# Patient Record
Sex: Female | Born: 1965 | Race: White | Hispanic: No | Marital: Married | Smoking: Never smoker
Health system: Southern US, Community
[De-identification: ages and names within clinical notes are randomized; demographics above are authoritative.]

## PROBLEM LIST (undated history)

## (undated) DIAGNOSIS — E785 Hyperlipidemia, unspecified: Secondary | ICD-10-CM

## (undated) DIAGNOSIS — I10 Essential (primary) hypertension: Secondary | ICD-10-CM

## (undated) DIAGNOSIS — G43909 Migraine, unspecified, not intractable, without status migrainosus: Secondary | ICD-10-CM

---

## 2007-01-16 ENCOUNTER — Ambulatory Visit: Payer: Self-pay | Admitting: Internal Medicine

## 2009-05-26 ENCOUNTER — Ambulatory Visit: Payer: Self-pay

## 2009-06-15 ENCOUNTER — Ambulatory Visit: Payer: Self-pay | Admitting: Women's Health

## 2010-06-02 ENCOUNTER — Ambulatory Visit: Payer: Self-pay | Admitting: Obstetrics and Gynecology

## 2011-07-12 ENCOUNTER — Ambulatory Visit: Payer: Self-pay | Admitting: Obstetrics and Gynecology

## 2011-07-20 ENCOUNTER — Ambulatory Visit: Payer: Self-pay | Admitting: Obstetrics and Gynecology

## 2012-07-12 ENCOUNTER — Ambulatory Visit: Payer: Self-pay | Admitting: Obstetrics and Gynecology

## 2013-07-17 ENCOUNTER — Ambulatory Visit: Payer: Self-pay | Admitting: Obstetrics and Gynecology

## 2014-09-17 ENCOUNTER — Other Ambulatory Visit: Payer: Self-pay | Admitting: Obstetrics and Gynecology

## 2014-09-17 ENCOUNTER — Inpatient Hospital Stay: Admission: RE | Admit: 2014-09-17 | Payer: Self-pay | Source: Ambulatory Visit

## 2014-09-17 DIAGNOSIS — Z1231 Encounter for screening mammogram for malignant neoplasm of breast: Secondary | ICD-10-CM

## 2014-10-08 ENCOUNTER — Ambulatory Visit
Admission: RE | Admit: 2014-10-08 | Discharge: 2014-10-08 | Disposition: A | Discharge status: Home / Self Care | Payer: Managed Care, Other (non HMO) | Source: Ambulatory Visit | Attending: Obstetrics and Gynecology | Admitting: Obstetrics and Gynecology

## 2014-10-08 DIAGNOSIS — Z1231 Encounter for screening mammogram for malignant neoplasm of breast: Secondary | ICD-10-CM | POA: Diagnosis present

## 2015-07-23 ENCOUNTER — Encounter: Payer: Self-pay | Admitting: *Deleted

## 2015-07-24 ENCOUNTER — Ambulatory Visit
Admission: RE | Admit: 2015-07-24 | Discharge: 2015-07-24 | Disposition: A | Discharge status: Home / Self Care | Payer: Managed Care, Other (non HMO) | Source: Ambulatory Visit | Attending: Gastroenterology | Admitting: Gastroenterology

## 2015-07-24 ENCOUNTER — Ambulatory Visit: Payer: Managed Care, Other (non HMO) | Admitting: Certified Registered Nurse Anesthetist

## 2015-07-24 ENCOUNTER — Encounter
Admission: RE | Disposition: A | Discharge status: Home / Self Care | Payer: Self-pay | Source: Ambulatory Visit | Attending: Gastroenterology

## 2015-07-24 ENCOUNTER — Encounter: Payer: Self-pay | Admitting: *Deleted

## 2015-07-24 DIAGNOSIS — K640 First degree hemorrhoids: Secondary | ICD-10-CM | POA: Diagnosis not present

## 2015-07-24 DIAGNOSIS — Z1211 Encounter for screening for malignant neoplasm of colon: Secondary | ICD-10-CM | POA: Insufficient documentation

## 2015-07-24 DIAGNOSIS — I10 Essential (primary) hypertension: Secondary | ICD-10-CM | POA: Diagnosis not present

## 2015-07-24 DIAGNOSIS — D123 Benign neoplasm of transverse colon: Secondary | ICD-10-CM | POA: Insufficient documentation

## 2015-07-24 DIAGNOSIS — D120 Benign neoplasm of cecum: Secondary | ICD-10-CM | POA: Insufficient documentation

## 2015-07-24 DIAGNOSIS — Z8371 Family history of colonic polyps: Secondary | ICD-10-CM | POA: Diagnosis not present

## 2015-07-24 DIAGNOSIS — G43909 Migraine, unspecified, not intractable, without status migrainosus: Secondary | ICD-10-CM | POA: Insufficient documentation

## 2015-07-24 DIAGNOSIS — E785 Hyperlipidemia, unspecified: Secondary | ICD-10-CM | POA: Diagnosis not present

## 2015-07-24 HISTORY — DX: Migraine, unspecified, not intractable, without status migrainosus: G43.909

## 2015-07-24 HISTORY — DX: Essential (primary) hypertension: I10

## 2015-07-24 HISTORY — DX: Hyperlipidemia, unspecified: E78.5

## 2015-07-24 HISTORY — PX: COLONOSCOPY WITH PROPOFOL: SHX5780

## 2015-07-24 SURGERY — COLONOSCOPY WITH PROPOFOL
Anesthesia: General

## 2015-07-24 MED ORDER — FENTANYL CITRATE (PF) 100 MCG/2ML IJ SOLN
INTRAMUSCULAR | Status: DC | PRN
  Administered 2015-07-24: 50 ug via INTRAVENOUS

## 2015-07-24 MED ORDER — MIDAZOLAM HCL 2 MG/2ML IJ SOLN
INTRAMUSCULAR | Status: DC | PRN
  Administered 2015-07-24: 2 mg via INTRAVENOUS

## 2015-07-24 MED ORDER — LIDOCAINE HCL (CARDIAC) 20 MG/ML IV SOLN
INTRAVENOUS | Status: DC | PRN
  Administered 2015-07-24: 60 mg via INTRAVENOUS

## 2015-07-24 MED ORDER — SODIUM CHLORIDE 0.9 % IV SOLN
INTRAVENOUS | Status: DC
  Administered 2015-07-24: 14:00:00 via INTRAVENOUS

## 2015-07-24 MED ORDER — PROPOFOL 10 MG/ML IV BOLUS
INTRAVENOUS | Status: DC | PRN
  Administered 2015-07-24: 50 mg via INTRAVENOUS
  Administered 2015-07-24: 20 mg via INTRAVENOUS
  Administered 2015-07-24: 10 mg via INTRAVENOUS
  Administered 2015-07-24: 50 mg via INTRAVENOUS

## 2015-07-24 MED ORDER — PROPOFOL 500 MG/50ML IV EMUL
INTRAVENOUS | Status: DC | PRN
  Administered 2015-07-24: 140 ug/kg/min via INTRAVENOUS

## 2015-07-24 NOTE — Anesthesia Preprocedure Evaluation (Signed)
Anesthesia Evaluation  Patient identified by MRN, date of birth, ID band Patient awake    Reviewed: Allergy & Precautions, H&P , NPO status , Patient's Chart, lab work & pertinent test results, reviewed documented beta blocker date and time   Airway Mallampati: II  TM Distance: >3 FB Neck ROM: full    Dental no notable dental hx. (+) Teeth Intact, Missing   Pulmonary neg pulmonary ROS,    Pulmonary exam normal breath sounds clear to auscultation       Cardiovascular Exercise Tolerance: Good negative cardio ROS Normal cardiovascular exam Rhythm:regular Rate:Normal     Neuro/Psych negative neurological ROS  negative psych ROS   GI/Hepatic negative GI ROS, Neg liver ROS,   Endo/Other  negative endocrine ROS  Renal/GU negative Renal ROS  negative genitourinary   Musculoskeletal   Abdominal   Peds  Hematology negative hematology ROS (+)   Anesthesia Other Findings Past Medical History:   Elevated lipids                                              Migraine                                                     Hypertension                                                 Reproductive/Obstetrics negative OB ROS                             Anesthesia Physical Anesthesia Plan  ASA: I  Anesthesia Plan: General   Post-op Pain Management:    Induction:   Airway Management Planned:   Additional Equipment:   Intra-op Plan:   Post-operative Plan:   Informed Consent: I have reviewed the patients History and Physical, chart, labs and discussed the procedure including the risks, benefits and alternatives for the proposed anesthesia with the patient or authorized representative who has indicated his/her understanding and acceptance.   Dental Advisory Given  Plan Discussed with: Anesthesiologist, CRNA and Surgeon  Anesthesia Plan Comments:         Anesthesia Quick Evaluation

## 2015-07-24 NOTE — Transfer of Care (Signed)
Immediate Anesthesia Transfer of Care Note  Patient: Barbara Griffin  Procedure(s) Performed: Procedure(s): COLONOSCOPY WITH PROPOFOL (N/A)  Patient Location: PACU  Anesthesia Type:General  Level of Consciousness: awake and alert   Airway & Oxygen Therapy: Patient Spontanous Breathing and Patient connected to nasal cannula oxygen  Post-op Assessment: Report given to RN and Post -op Vital signs reviewed and stable  Post vital signs: Reviewed and stable  Last Vitals:  Filed Vitals:   07/24/15 1359  BP: 120/64  Pulse: 88  Temp: 36.3 C  Resp: 16    Last Pain: There were no vitals filed for this visit.       Complications: No apparent anesthesia complications

## 2015-07-24 NOTE — H&P (Signed)
  Primary Care Physician:  Idelle Crouch, MD  Pre-Procedure History & Physical: HPI:  Barbara Griffin is a 50 y.o. female is here for an colonoscopy.   Past Medical History  Diagnosis Date  . Elevated lipids   . Migraine   . Hypertension     History reviewed. No pertinent past surgical history.  Prior to Admission medications   Medication Sig Start Date End Date Taking? Authorizing Provider  acetaminophen (TYLENOL) 325 MG tablet Take 650 mg by mouth every 6 (six) hours as needed.   Yes Historical Provider, MD  norethindrone-ethinyl estradiol (CYCLAFEM,ALYACEN) 0.5/0.75/1-35 MG-MCG tablet Take 1 tablet by mouth daily.   Yes Historical Provider, MD    Allergies as of 07/15/2015  . (Not on File)    Family History  Problem Relation Age of Onset  . Breast cancer Paternal Grandmother 68  . Breast cancer Sister 34    Social History   Social History  . Marital Status: Married    Spouse Name: N/A  . Number of Children: N/A  . Years of Education: N/A   Occupational History  . Not on file.   Social History Main Topics  . Smoking status: Never Smoker   . Smokeless tobacco: Not on file  . Alcohol Use: Yes  . Drug Use: No  . Sexual Activity: Not on file   Other Topics Concern  . Not on file   Social History Narrative     Physical Exam: BP 120/64 mmHg  Pulse 88  Temp(Src) 97.4 F (36.3 C) (Tympanic)  Resp 16  Ht 5\' 4"  (1.626 m)  Wt 52.164 kg (115 lb)  BMI 19.73 kg/m2  SpO2 100%  LMP  General:   Alert,  pleasant and cooperative in NAD Head:  Normocephalic and atraumatic. Neck:  Supple; no masses or thyromegaly. Lungs:  Clear throughout to auscultation.    Heart:  Regular rate and rhythm. Abdomen:  Soft, nontender and nondistended. Normal bowel sounds, without guarding, and without rebound.   Neurologic:  Alert and  oriented x4;  grossly normal neurologically.  Impression/Plan: Venida Dunkley is here for an colonoscopy to be performed for screening, + fam  hx polyps  Risks, benefits, limitations, and alternatives regarding  colonoscopy have been reviewed with the patient.  Questions have been answered.  All parties agreeable.   Josefine Class, MD  07/24/2015, 2:41 PM

## 2015-07-24 NOTE — Op Note (Signed)
[facility]astroenterology Patient Name: Barbara Griffin Procedure Date: 07/24/2015 2:50 PM MRN: [id number] Account #: [id number] Date of Birth: 1965-06-22 Admit Type: Outpatient Age: 50 Room: [facility] ENDO ROOM 2 Gender: Female Note Status: Finalized Procedure:            Colonoscopy Indications:          Colon cancer screening in patient at increased risk:                        Family history of 1st-degree relative (sister) with                        colon polyps before age 71 years, This is the patient's                        first colonoscopy, MGF with colon cancer. Patient Profile:      This is a 50 year old female. Providers:            Gerrit Heck. Rayann Heman, MD Referring MD:         Leonie Douglas. Doy Hutching, MD (Referring MD) Medicines:            Propofol per Anesthesia Complications:        No immediate complications. Procedure:            Pre-Anesthesia Assessment:                       - Prior to the procedure, a History and Physical was                        performed, and patient medications, allergies and                        sensitivities were reviewed. The patient's tolerance of                        previous anesthesia was reviewed.                       After obtaining informed consent, the colonoscope was                        passed under direct vision. Throughout the procedure,                        the patient's blood pressure, pulse, and oxygen                        saturations were monitored continuously. The Olympus                        PCF-H180AL colonoscope ( S#: [id number] ) was introduced                        through the anus and advanced to the the cecum,                        identified by appendiceal orifice and ileocecal valve.                        The  colonoscopy was somewhat difficult due to a                        tortuous colon. The patient tolerated the procedure                        well. The quality of the bowel  preparation was                        excellent. Findings:      The perianal and digital rectal examinations were normal.      A 3 mm polyp was found in the cecum. The polyp was sessile. The polyp       was removed with a jumbo cold forceps. Resection and retrieval were       complete.      A 3 mm polyp was found in the proximal transverse colon. The polyp was       sessile. The polyp was removed with a jumbo cold forceps. Resection and       retrieval were complete.      Internal hemorrhoids were found during retroflexion. The hemorrhoids       were Grade I (internal hemorrhoids that do not prolapse).      The exam was otherwise without abnormality. Impression:           - One 3 mm polyp in the cecum, removed with a jumbo                        cold forceps. Resected and retrieved.                       - One 3 mm polyp in the proximal transverse colon,                        removed with a jumbo cold forceps. Resected and                        retrieved.                       - Internal hemorrhoids.                       - The examination was otherwise normal. Recommendation:       - Observe patient in GI recovery unit.                       - High fiber diet.                       - Continue present medications.                       - Await pathology results.                       - Repeat colonoscopy for surveillance based on                        pathology results.                       - Return to referring physician.                       -  The findings and recommendations were discussed with                        the patient.                       - The findings and recommendations were discussed with                        the patient's family. Procedure Code(s):    --- Professional ---                       [phone], Colonoscopy, flexible; with biopsy, single or                        multiple Diagnosis Code(s):    --- Professional ---                       Z83.71,  Family history of colonic polyps                       D12.0, Benign neoplasm of cecum                       D12.3, Benign neoplasm of transverse colon (hepatic                        flexure or splenic flexure)                       K64.0, First degree hemorrhoids CPT copyright 2016 American Medical Association. All rights reserved. The codes documented in this report are preliminary and upon coder review may  be revised to meet current compliance requirements. Mellody Life, MD 07/24/2015 3:16:11 PM This report has been signed electronically. Number of Addenda: 0 Note Initiated On: 07/24/2015 2:50 PM Scope Withdrawal Time: 0 hours 12 minutes 18 seconds  Total Procedure Duration: 0 hours 18 minutes 49 seconds       [facility]

## 2015-07-24 NOTE — Discharge Instructions (Signed)

## 2015-07-24 NOTE — Anesthesia Procedure Notes (Signed)
Performed by: Daija Routson Pre-anesthesia Checklist: Patient identified, Emergency Drugs available, Patient being monitored, Suction available and Timeout performed Patient Re-evaluated:Patient Re-evaluated prior to inductionOxygen Delivery Method: Nasal cannula Intubation Type: IV induction       

## 2015-07-24 NOTE — Anesthesia Postprocedure Evaluation (Signed)
Anesthesia Post Note  Patient: Barbara Griffin  Procedure(s) Performed: Procedure(s) (LRB): COLONOSCOPY WITH PROPOFOL (N/A)  Patient location during evaluation: Endoscopy Anesthesia Type: General Level of consciousness: awake and alert Pain management: pain level controlled Vital Signs Assessment: post-procedure vital signs reviewed and stable Respiratory status: spontaneous breathing, nonlabored ventilation, respiratory function stable and patient connected to nasal cannula oxygen Cardiovascular status: blood pressure returned to baseline and stable Postop Assessment: no signs of nausea or vomiting Anesthetic complications: no    Last Vitals:  Filed Vitals:   07/24/15 1550 07/24/15 1600  BP: 99/61 103/69  Pulse: 59 69  Temp:    Resp: 14 27    Last Pain: There were no vitals filed for this visit.               Martha Clan

## 2015-07-27 ENCOUNTER — Encounter: Payer: Self-pay | Admitting: Gastroenterology

## 2015-07-28 LAB — SURGICAL PATHOLOGY

## 2015-09-17 ENCOUNTER — Other Ambulatory Visit: Payer: Self-pay | Admitting: Obstetrics and Gynecology

## 2015-09-17 DIAGNOSIS — Z1231 Encounter for screening mammogram for malignant neoplasm of breast: Secondary | ICD-10-CM

## 2015-09-17 DIAGNOSIS — Z13820 Encounter for screening for osteoporosis: Secondary | ICD-10-CM

## 2015-10-20 ENCOUNTER — Ambulatory Visit: Payer: Managed Care, Other (non HMO)

## 2015-11-10 ENCOUNTER — Ambulatory Visit
Admission: RE | Admit: 2015-11-10 | Discharge: 2015-11-10 | Disposition: A | Discharge status: Home / Self Care | Payer: Managed Care, Other (non HMO) | Source: Ambulatory Visit | Attending: Obstetrics and Gynecology | Admitting: Obstetrics and Gynecology

## 2015-11-10 DIAGNOSIS — Z13820 Encounter for screening for osteoporosis: Secondary | ICD-10-CM | POA: Diagnosis present

## 2015-11-10 DIAGNOSIS — Z1231 Encounter for screening mammogram for malignant neoplasm of breast: Secondary | ICD-10-CM | POA: Diagnosis not present

## 2016-09-20 ENCOUNTER — Other Ambulatory Visit: Payer: Self-pay | Admitting: Obstetrics and Gynecology

## 2016-09-20 DIAGNOSIS — Z1231 Encounter for screening mammogram for malignant neoplasm of breast: Secondary | ICD-10-CM

## 2016-11-15 ENCOUNTER — Ambulatory Visit
Admission: RE | Admit: 2016-11-15 | Discharge: 2016-11-15 | Disposition: A | Discharge status: Home / Self Care | Payer: Commercial Managed Care - PPO | Source: Ambulatory Visit | Attending: Obstetrics and Gynecology | Admitting: Obstetrics and Gynecology

## 2016-11-15 DIAGNOSIS — Z1231 Encounter for screening mammogram for malignant neoplasm of breast: Secondary | ICD-10-CM

## 2017-12-19 ENCOUNTER — Other Ambulatory Visit: Payer: Self-pay | Admitting: Obstetrics and Gynecology

## 2017-12-19 DIAGNOSIS — Z1231 Encounter for screening mammogram for malignant neoplasm of breast: Secondary | ICD-10-CM

## 2018-01-03 ENCOUNTER — Ambulatory Visit
Admission: RE | Admit: 2018-01-03 | Discharge: 2018-01-03 | Disposition: A | Discharge status: Home / Self Care | Payer: Commercial Managed Care - PPO | Source: Ambulatory Visit | Attending: Obstetrics and Gynecology | Admitting: Obstetrics and Gynecology

## 2018-01-03 DIAGNOSIS — Z1231 Encounter for screening mammogram for malignant neoplasm of breast: Secondary | ICD-10-CM

## 2018-01-11 ENCOUNTER — Other Ambulatory Visit: Payer: Self-pay | Admitting: Obstetrics and Gynecology

## 2018-01-11 DIAGNOSIS — R928 Other abnormal and inconclusive findings on diagnostic imaging of breast: Secondary | ICD-10-CM

## 2018-01-22 ENCOUNTER — Ambulatory Visit
Admission: RE | Admit: 2018-01-22 | Discharge: 2018-01-22 | Disposition: A | Discharge status: Home / Self Care | Payer: Commercial Managed Care - PPO | Source: Ambulatory Visit | Attending: Obstetrics and Gynecology | Admitting: Obstetrics and Gynecology

## 2018-01-22 DIAGNOSIS — R928 Other abnormal and inconclusive findings on diagnostic imaging of breast: Secondary | ICD-10-CM

## 2018-08-17 ENCOUNTER — Other Ambulatory Visit: Payer: Self-pay | Admitting: Obstetrics and Gynecology

## 2018-08-17 DIAGNOSIS — N63 Unspecified lump in unspecified breast: Secondary | ICD-10-CM

## 2018-09-19 ENCOUNTER — Ambulatory Visit
Admission: RE | Admit: 2018-09-19 | Discharge: 2018-09-19 | Disposition: A | Discharge status: Home / Self Care | Payer: Commercial Managed Care - PPO | Source: Ambulatory Visit | Attending: Obstetrics and Gynecology | Admitting: Obstetrics and Gynecology

## 2018-09-19 ENCOUNTER — Other Ambulatory Visit: Payer: Self-pay

## 2018-09-19 DIAGNOSIS — N63 Unspecified lump in unspecified breast: Secondary | ICD-10-CM

## 2019-06-06 ENCOUNTER — Ambulatory Visit: Payer: Commercial Managed Care - PPO | Attending: Internal Medicine

## 2019-06-06 DIAGNOSIS — Z23 Encounter for immunization: Secondary | ICD-10-CM

## 2019-06-06 NOTE — Progress Notes (Signed)
   Covid-19 Vaccination Clinic  Name:  Barbara Griffin    MRN: [id number] DOB: [birth year]  06/06/2019  [name] was observed post Covid-19 immunization for 15 minutes without incident. She was provided with Vaccine Information Sheet and instruction to access the V-Safe system.   [name] was instructed to call 911 with any severe reactions post vaccine: Marland Kitchen Difficulty breathing  . Swelling of face and throat  . A fast heartbeat  . A bad rash all over body  . Dizziness and weakness   Immunizations Administered    Name Date Dose VIS Date Route   Pfizer COVID-19 Vaccine 06/06/2019 10:26 AM 0.3 mL 03/08/2019 Intramuscular   Manufacturer: [place]   Lot: [id number]   [place]: [id number]

## 2019-07-03 ENCOUNTER — Ambulatory Visit: Payer: Commercial Managed Care - PPO | Attending: Internal Medicine

## 2019-07-03 DIAGNOSIS — Z23 Encounter for immunization: Secondary | ICD-10-CM

## 2019-07-03 NOTE — Progress Notes (Signed)
   Covid-19 Vaccination Clinic  Name:  Solomia Keppler    MRN: [id number] DOB: 1965/11/12  07/03/2019  Ms. Pless was observed post Covid-19 immunization for 15 minutes without incident. She was provided with Vaccine Information Sheet and instruction to access the V-Safe system.   Ms. Hatch was instructed to call 911 with any severe reactions post vaccine: Marland Kitchen Difficulty breathing  . Swelling of face and throat  . A fast heartbeat  . A bad rash all over body  . Dizziness and weakness   Immunizations Administered    Name Date Dose VIS Date Route   Pfizer COVID-19 Vaccine 07/03/2019 10:41 AM 0.3 mL 03/08/2019 Intramuscular   Manufacturer: [place]   Lot: [id number]   [place]: [id number]

## 2019-08-22 ENCOUNTER — Other Ambulatory Visit: Payer: Self-pay | Admitting: Internal Medicine

## 2019-08-22 DIAGNOSIS — Z1231 Encounter for screening mammogram for malignant neoplasm of breast: Secondary | ICD-10-CM

## 2020-08-26 ENCOUNTER — Other Ambulatory Visit: Payer: Self-pay | Admitting: Internal Medicine

## 2020-08-26 DIAGNOSIS — Z1231 Encounter for screening mammogram for malignant neoplasm of breast: Secondary | ICD-10-CM

## 2020-10-06 ENCOUNTER — Ambulatory Visit
Admission: RE | Admit: 2020-10-06 | Discharge: 2020-10-06 | Disposition: A | Discharge status: Home / Self Care | Payer: Commercial Managed Care - PPO | Source: Ambulatory Visit | Attending: Internal Medicine | Admitting: Internal Medicine

## 2020-10-06 ENCOUNTER — Other Ambulatory Visit: Payer: Self-pay

## 2020-10-06 DIAGNOSIS — Z1231 Encounter for screening mammogram for malignant neoplasm of breast: Secondary | ICD-10-CM | POA: Insufficient documentation

## 2020-12-14 ENCOUNTER — Other Ambulatory Visit: Payer: Self-pay

## 2020-12-14 ENCOUNTER — Ambulatory Visit: Payer: Commercial Managed Care - PPO | Admitting: Dermatology

## 2020-12-14 DIAGNOSIS — D229 Melanocytic nevi, unspecified: Secondary | ICD-10-CM

## 2020-12-14 DIAGNOSIS — D485 Neoplasm of uncertain behavior of skin: Secondary | ICD-10-CM

## 2020-12-14 DIAGNOSIS — C4491 Basal cell carcinoma of skin, unspecified: Secondary | ICD-10-CM

## 2020-12-14 DIAGNOSIS — L578 Other skin changes due to chronic exposure to nonionizing radiation: Secondary | ICD-10-CM

## 2020-12-14 DIAGNOSIS — C44519 Basal cell carcinoma of skin of other part of trunk: Secondary | ICD-10-CM

## 2020-12-14 DIAGNOSIS — D2239 Melanocytic nevi of other parts of face: Secondary | ICD-10-CM | POA: Diagnosis not present

## 2020-12-14 HISTORY — DX: Basal cell carcinoma of skin, unspecified: C44.91

## 2020-12-14 NOTE — Progress Notes (Signed)
   New Patient Visit  Subjective  Barbara Griffin is a 55 y.o. female who presents for the following: Other (Spot on chest that gets scaly sometimes). She has other areas to be evaluated today.  The following portions of the chart were reviewed this encounter and updated as appropriate:   Allergies  Meds  Problems  Med Hx  Surg Hx  Fam Hx     Review of Systems:  No other skin or systemic complaints except as noted in HPI or Assessment and Plan.  Objective  Well appearing patient in no apparent distress; mood and affect are within normal limits.  A focused examination was performed including face, chest. Relevant physical exam findings are noted in the Assessment and Plan.  Right chest medial infraclavicular 1.1 cm scar with pink papule         Right lat infraorbital Brown flat papule   Assessment & Plan  Neoplasm of uncertain behavior of skin Right chest medial infraclavicular  Skin / nail biopsy Type of biopsy: tangential   Informed consent: discussed and consent obtained   Timeout: patient name, date of birth, surgical site, and procedure verified   Procedure prep:  Patient was prepped and draped in usual sterile fashion Prep type:  Isopropyl alcohol Anesthesia: the lesion was anesthetized in a standard fashion   Anesthetic:  1% lidocaine w/ epinephrine 1-100,000 buffered w/ 8.4% NaHCO3 Instrument used: flexible razor blade   Hemostasis achieved with: pressure, aluminum chloride and electrodesiccation   Outcome: patient tolerated procedure well   Post-procedure details: sterile dressing applied and wound care instructions given   Dressing type: bandage and petrolatum    Specimen 1 - Surgical pathology Differential Diagnosis: BCC vs other  Check Margins: No  Nevus Right lat infraorbital Benign appearing, observe. Do not recommend removing due to small size unless it changes or becomes symptomatic.  Actinic Damage - chronic, secondary to cumulative UV  radiation exposure/sun exposure over time - diffuse scaly erythematous macules with underlying dyspigmentation - Recommend daily broad spectrum sunscreen SPF 30+ to sun-exposed areas, reapply every 2 hours as needed.  - Recommend staying in the shade or wearing long sleeves, sun glasses (UVA+UVB protection) and wide brim hats (4-inch brim around the entire circumference of the hat). - Call for new or changing lesions.  Return Pending biopsy results.  I, Ashok Cordia, CMA, am acting as scribe for Sarina Ser, MD . Documentation: I have reviewed the above documentation for accuracy and completeness, and I agree with the above.  Sarina Ser, MD

## 2020-12-14 NOTE — Patient Instructions (Signed)

## 2020-12-17 ENCOUNTER — Encounter: Payer: Self-pay | Admitting: Dermatology

## 2020-12-18 ENCOUNTER — Telehealth: Payer: Self-pay

## 2020-12-18 NOTE — Telephone Encounter (Signed)
Advised pt of bx results/sh ?

## 2020-12-18 NOTE — Telephone Encounter (Signed)
-----   Message from Ralene Bathe, MD sent at 12/18/2020 10:47 AM EDT ----- Diagnosis Skin , right chest medial infraclavicular BASAL CELL CARCINOMA, NODULAR PATTERN, BASE INVOLVED  Cancer - BCC Schedule surgery

## 2021-01-26 ENCOUNTER — Encounter: Payer: Commercial Managed Care - PPO | Admitting: Dermatology

## 2021-02-02 ENCOUNTER — Ambulatory Visit: Payer: Commercial Managed Care - PPO | Admitting: Dermatology

## 2021-02-02 ENCOUNTER — Encounter: Payer: Self-pay | Admitting: Dermatology

## 2021-02-02 ENCOUNTER — Other Ambulatory Visit: Payer: Self-pay

## 2021-02-02 DIAGNOSIS — C44519 Basal cell carcinoma of skin of other part of trunk: Secondary | ICD-10-CM

## 2021-02-02 DIAGNOSIS — C4441 Basal cell carcinoma of skin of scalp and neck: Secondary | ICD-10-CM | POA: Diagnosis not present

## 2021-02-02 MED ORDER — MUPIROCIN 2 % EX OINT: 1.0000 "application " | TOPICAL_OINTMENT | Freq: Every day | CUTANEOUS | 0 refills | Status: AC

## 2021-02-02 NOTE — Patient Instructions (Signed)
Wound Care Instructions  Cleanse wound gently with soap and water once a day then pat dry with clean gauze. Apply a thing coat of Petrolatum (petroleum jelly, "Vaseline") over the wound (unless you have an allergy to this). We recommend that you use a new, sterile tube of Vaseline. Do not pick or remove scabs. Do not remove the yellow or white "healing tissue" from the base of the wound.  Cover the wound with fresh, clean, nonstick gauze and secure with paper tape. You may use Band-Aids in place of gauze and tape if the would is small enough, but would recommend trimming much of the tape off as there is often too much. Sometimes Band-Aids can irritate the skin.  You should call the office for your biopsy report after 1 week if you have not already been contacted.  If you experience any problems, such as abnormal amounts of bleeding, swelling, significant bruising, significant pain, or evidence of infection, please call the office immediately.  FOR ADULT SURGERY PATIENTS: If you need something for pain relief you may take 1 extra strength Tylenol (acetaminophen) AND 2 Ibuprofen (200mg each) together every 4 hours as needed for pain. (do not take these if you are allergic to them or if you have a reason you should not take them.) Typically, you may only need pain medication for 1 to 3 days.   If you have any questions or concerns for your doctor, please call our main line at [phone] and press option 4 to reach your doctor's medical assistant. If no one answers, please leave a voicemail as directed and we will return your call as soon as possible. Messages left after 4 pm will be answered the following business day.   You may also send us a message via MyChart. We typically respond to MyChart messages within 1-2 business days.  For prescription refills, please ask your pharmacy to contact our office. Our fax number is [phone].  If you have an urgent issue when the clinic is closed that  cannot wait until the next business day, you can page your doctor at the number below.    Please note that while we do our best to be available for urgent issues outside of office hours, we are not available 24/7.   If you have an urgent issue and are unable to reach us, you may choose to seek medical care at your doctor's office, retail clinic, urgent care center, or emergency room.  If you have a medical emergency, please immediately call 911 or go to the emergency department.  Pager Numbers  - Dr. Kowalski: [phone]  - Dr. Moye: [phone]  - Dr. Stewart: [phone]  In the event of inclement weather, please call our main line at [phone] for an update on the status of any delays or closures.  Dermatology Medication Tips: Please keep the boxes that topical medications come in in order to help keep track of the instructions about where and how to use these. Pharmacies typically print the medication instructions only on the boxes and not directly on the medication tubes.   If your medication is too expensive, please contact our office at [phone] option 4 or send us a message through MyChart.   We are unable to tell what your co-pay for medications will be in advance as this is different depending on your insurance coverage. However, we may be able to find a substitute medication at lower cost or fill out paperwork to get insurance to cover a needed   medication.   If a prior authorization is required to get your medication covered by your insurance company, please allow us 1-2 business days to complete this process.  Drug prices often vary depending on where the prescription is filled and some pharmacies may offer cheaper prices.  The website www.goodrx.com contains coupons for medications through different pharmacies. The prices here do not account for what the cost may be with help from insurance (it may be cheaper with your insurance), but the website can give you the  price if you did not use any insurance.  - You can print the associated coupon and take it with your prescription to the pharmacy.  - You may also stop by our office during regular business hours and pick up a GoodRx coupon card.  - If you need your prescription sent electronically to a different pharmacy, notify our office through [facility] MyChart or by phone at [phone] option 4.   

## 2021-02-02 NOTE — Progress Notes (Signed)
   Follow-Up Visit   Subjective  Barbara Griffin is a 55 y.o. female who presents for the following: BCC bx proven (R chest medial infraclavicular, pt presents for excision).  The following portions of the chart were reviewed this encounter and updated as appropriate:   Allergies  Meds  Problems  Med Hx  Surg Hx  Fam Hx     Review of Systems:  No other skin or systemic complaints except as noted in HPI or Assessment and Plan.  Objective  Well appearing patient in no apparent distress; mood and affect are within normal limits.  A focused examination was performed including chest. Relevant physical exam findings are noted in the Assessment and Plan.  R chest medical infraclavicular Pink bx site 2.5 x 1.5cm   Assessment & Plan  Basal cell carcinoma (BCC) of skin of other part of torso R chest medial infraclavicular and extending onto the neck/clavicle area  Skin excision  Lesion length (cm):  2.5 Lesion width (cm):  1.5 Margin per side (cm):  0.2 Total excision diameter (cm):  2.9 Informed consent: discussed and consent obtained   Timeout: patient name, date of birth, surgical site, and procedure verified   Procedure prep:  Patient was prepped and draped in usual sterile fashion Prep type:  Isopropyl alcohol and povidone-iodine Anesthesia: the lesion was anesthetized in a standard fashion   Anesthetic:  1% lidocaine w/ epinephrine 1-100,000 buffered w/ 8.4% NaHCO3 (6cclido w/ epi, 3cc bupivicaine, Total = 9cc) Instrument used: #15 blade   Hemostasis achieved with: pressure   Hemostasis achieved with comment:  Electrocautery Outcome: patient tolerated procedure well with no complications   Post-procedure details: sterile dressing applied and wound care instructions given   Dressing type: bandage, pressure dressing and bacitracin (Mupirocin)    Skin repair Complexity:  Complex Final length (cm):  5 Reason for type of repair: reduce tension to allow closure, reduce the  risk of dehiscence, infection, and necrosis, reduce subcutaneous dead space and avoid a hematoma, allow closure of the large defect, preserve normal anatomy, preserve normal anatomical and functional relationships and enhance both functionality and cosmetic results   Undermining: area extensively undermined   Undermining comment:  Undermining Defect 2.9cm Subcutaneous layers (deep stitches):  Suture size:  2-0 Suture type: Vicryl (polyglactin 910)   Subcutaneous suture technique: Inverted Dermal. Fine/surface layer approximation (top stitches):  Suture size:  3-0 Suture type: nylon   Stitches: simple running   Suture removal (days):  7 Hemostasis achieved with: pressure Outcome: patient tolerated procedure well with no complications   Post-procedure details: sterile dressing applied and wound care instructions given   Dressing type: bandage, pressure dressing and bacitracin (Mupirocin)    mupirocin ointment (BACTROBAN) 2 % Apply 1 application topically daily. Qd to excision site  Specimen 1 - Surgical pathology Differential Diagnosis: Bx proven BCC  Check Margins: yes Pink bx site 2.5 x 1.5cm [id number]  BCC bx proven  Start Mupirocin oint qd to excision site  Return in about 1 week (around 02/09/2021) for suture removal and discuss thinning hair.  I, Othelia Pulling, RMA, am acting as scribe for Sarina Ser, MD . Documentation: I have reviewed the above documentation for accuracy and completeness, and I agree with the above.  Sarina Ser, MD

## 2021-02-03 ENCOUNTER — Telehealth: Payer: Self-pay

## 2021-02-03 NOTE — Telephone Encounter (Signed)
Left pt message to call if any problems from yesterday's surgery.Barbara Griffin

## 2021-02-09 ENCOUNTER — Ambulatory Visit (INDEPENDENT_AMBULATORY_CARE_PROVIDER_SITE_OTHER): Payer: Commercial Managed Care - PPO | Admitting: Dermatology

## 2021-02-09 ENCOUNTER — Other Ambulatory Visit: Payer: Self-pay

## 2021-02-09 DIAGNOSIS — L649 Androgenic alopecia, unspecified: Secondary | ICD-10-CM | POA: Diagnosis not present

## 2021-02-09 DIAGNOSIS — Z85828 Personal history of other malignant neoplasm of skin: Secondary | ICD-10-CM | POA: Diagnosis not present

## 2021-02-09 MED ORDER — MINOXIDIL 2.5 MG PO TABS: ORAL_TABLET | ORAL | 5 refills | Status: DC

## 2021-02-09 NOTE — Progress Notes (Signed)
   Follow-Up Visit   Subjective  Barbara Griffin is a 55 y.o. female who presents for the following: post op/suture removal (Pathology proven margins free BCC of the R chest med infraclavicular. Patient is here today for suture removal.) and  Alopecia (Patient has noticed diffuse hair thinning especially over the past years. She is using Biotin daily and would like to discuss other treatment options.).  The following portions of the chart were reviewed this encounter and updated as appropriate:   Tobacco  Allergies  Meds  Problems  Med Hx  Surg Hx  Fam Hx     Review of Systems:  No other skin or systemic complaints except as noted in HPI or Assessment and Plan.  Objective  Well appearing patient in no apparent distress; mood and affect are within normal limits.  A focused examination was performed including the scalp and chest. Relevant physical exam findings are noted in the Assessment and Plan.  Scalp Diffuse thinning of hair.             R chest med infra clavicular Healing excision site.    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 Discussed numerous treatment options including PRP injections and hair transplants (not performed at our office), topical and oral Minoxidil, Biotin, and LLL therapy. Patient will continue Biotin and would like to start Minoxidil 1.25 mg po QD. #15 5RF.  Discussed side effect of temporal hair and ankle swelling.  If she has any of these side effects she is to stop and call the office  minoxidil (LONITEN) 2.5 MG tablet - Scalp Take 1/2 tab po QD.  History of basal cell carcinoma of skin R chest med infra clavicular  Encounter for Removal of Sutures - Incision site at the R chest med infra clavicular is clean, dry  and intact - Wound cleansed, sutures removed, wound cleansed and steri strips applied.  - Discussed pathology results showing margins free basal cell carcinoma.  - Patient advised to keep steri-strips dry until they fall off. - Scars remodel for a full year. - Once steri-strips fall off, patient can apply over-the-counter silicone scar cream each night to help with scar remodeling if desired. - Patient advised to call with any concerns or if they notice any new or changing lesions.  Return in about 6 months (around 08/09/2021) for TBSE and alopecia f/u.  Luther Redo, CMA, am acting as scribe for Sarina Ser, MD . Documentation: I have reviewed the above documentation for accuracy and completeness, and I agree with the above.  Sarina Ser, MD

## 2021-02-09 NOTE — Patient Instructions (Signed)
Recommend Serica moisturizing scar formula cream every night or [facility] brand or Mederma silicone scar sheet every night for the first year after a scar appears to help with scar remodeling if desired. Scars remodel on their own for a full year.   If you have any questions or concerns for your doctor, please call our main line at [phone] and press option 4 to reach your doctor's medical assistant. If no one answers, please leave a voicemail as directed and we will return your call as soon as possible. Messages left after 4 pm will be answered the following business day.   You may also send [place] a message via [facility]. We typically respond to MyChart messages within 1-2 business days.  For prescription refills, please ask your pharmacy to contact our office. Our fax number is [phone].  If you have an urgent issue when the clinic is closed that cannot wait until the next business day, you can page your doctor at the number below.    Please note that while we do our best to be available for urgent issues outside of office hours, we are not available 24/7.   If you have an urgent issue and are unable to reach [place], you may choose to seek medical care at your doctor's office, retail clinic, urgent care center, or emergency room.  If you have a medical emergency, please immediately call 911 or go to the emergency department.  Pager Numbers  - Dr. Nehemiah Massed: [phone]  - Dr. Laurence Ferrari: [phone]  - Dr. Nicole Kindred: [phone]  In the event of inclement weather, please call our main line at [phone] for an update on the status of any delays or closures.  Dermatology Medication Tips: Please keep the boxes that topical medications come in in order to help keep track of the instructions about where and how to use these. Pharmacies typically print the medication instructions only on the boxes and not directly on the medication tubes.   If your medication is too expensive, please contact  our office at [phone] option 4 or send [place] a message through [place].   We are unable to tell what your co-pay for medications will be in advance as this is different depending on your insurance coverage. However, we may be able to find a substitute medication at lower cost or fill out paperwork to get insurance to cover a needed medication.   If a prior authorization is required to get your medication covered by your insurance company, please allow [place] 1-2 business days to complete this process.  Drug prices often vary depending on where the prescription is filled and some pharmacies may offer cheaper prices.  The website www.goodrx.com contains coupons for medications through different pharmacies. The prices here do not account for what the cost may be with help from insurance (it may be cheaper with your insurance), but the website can give you the price if you did not use any insurance.  - You can print the associated coupon and take it with your prescription to the pharmacy.  - You may also stop by our office during regular business hours and pick up a GoodRx coupon card.  - If you need your prescription sent electronically to a different pharmacy, notify our office through [facility] or by phone at [phone] option 4.

## 2021-02-14 ENCOUNTER — Encounter: Payer: Self-pay | Admitting: Dermatology

## 2021-05-13 ENCOUNTER — Other Ambulatory Visit: Payer: Self-pay | Admitting: Obstetrics and Gynecology

## 2021-05-13 DIAGNOSIS — Z1231 Encounter for screening mammogram for malignant neoplasm of breast: Secondary | ICD-10-CM

## 2021-07-29 ENCOUNTER — Other Ambulatory Visit: Payer: Self-pay | Admitting: Dermatology

## 2021-07-29 DIAGNOSIS — L649 Androgenic alopecia, unspecified: Secondary | ICD-10-CM

## 2021-08-10 ENCOUNTER — Ambulatory Visit: Payer: Commercial Managed Care - PPO | Admitting: Dermatology

## 2021-08-10 DIAGNOSIS — L814 Other melanin hyperpigmentation: Secondary | ICD-10-CM

## 2021-08-10 DIAGNOSIS — Z85828 Personal history of other malignant neoplasm of skin: Secondary | ICD-10-CM

## 2021-08-10 DIAGNOSIS — D492 Neoplasm of unspecified behavior of bone, soft tissue, and skin: Secondary | ICD-10-CM

## 2021-08-10 DIAGNOSIS — D229 Melanocytic nevi, unspecified: Secondary | ICD-10-CM

## 2021-08-10 DIAGNOSIS — D1800 Hemangioma unspecified site: Secondary | ICD-10-CM

## 2021-08-10 DIAGNOSIS — D239 Other benign neoplasm of skin, unspecified: Secondary | ICD-10-CM

## 2021-08-10 DIAGNOSIS — D225 Melanocytic nevi of trunk: Secondary | ICD-10-CM

## 2021-08-10 DIAGNOSIS — Z1283 Encounter for screening for malignant neoplasm of skin: Secondary | ICD-10-CM | POA: Diagnosis not present

## 2021-08-10 DIAGNOSIS — L821 Other seborrheic keratosis: Secondary | ICD-10-CM

## 2021-08-10 DIAGNOSIS — L578 Other skin changes due to chronic exposure to nonionizing radiation: Secondary | ICD-10-CM

## 2021-08-10 DIAGNOSIS — L649 Androgenic alopecia, unspecified: Secondary | ICD-10-CM

## 2021-08-10 HISTORY — DX: Other benign neoplasm of skin, unspecified: D23.9

## 2021-08-10 MED ORDER — MINOXIDIL 2.5 MG PO TABS: ORAL_TABLET | ORAL | 3 refills | Status: DC

## 2021-08-10 NOTE — Progress Notes (Unsigned)
Follow-Up Visit   Subjective  Barbara Griffin is a 56 y.o. female who presents for the following: Annual Exam (The patient presents for Total-Body Skin Exam (TBSE) for skin cancer screening and mole check.  The patient has spots, moles and lesions to be evaluated, some may be new or changing and the patient has concerns that these could be cancer. ). 6 months f/u on Alopecia treating with Minoxidil 1/2 tablet daily with a good response, pt would like to increase Minoxidil.  The following portions of the chart were reviewed this encounter and updated as appropriate:   Tobacco  Allergies  Meds  Problems  Med Hx  Surg Hx  Fam Hx     Review of Systems:  No other skin or systemic complaints except as noted in HPI or Assessment and Plan.  Objective  Well appearing patient in no apparent distress; mood and affect are within normal limits.  A full examination was performed including scalp, head, eyes, ears, nose, lips, neck, chest, axillae, abdomen, back, buttocks, bilateral upper extremities, bilateral lower extremities, hands, feet, fingers, toes, fingernails, and toenails. All findings within normal limits unless otherwise noted below.  Scalp Diffuse thinning of the crown and widening of the midline part with retention of the frontal hairline - Reviewed progressive nature and prognosis.            left upper back 3.0 cm lat to spine 0.6 cm irregular brown macule    Assessment & Plan  Androgenic alopecia Scalp See photos in Media  Chronic and persistent condition with duration or expected duration over one year. Condition is symptomatic / bothersome to patient. Not to goal.  Female Androgenic Alopecia is a chronic condition related to genetics and/or hormonal changes.  In women androgenetic alopecia is commonly associated with menopause but may occur any time after puberty.  It causes hair thinning primarily on the crown with widening of the part and temporal hairline  recession.  Can use OTC Rogaine (minoxidil) 5% solution/foam as directed.  Oral treatments in female patients who have no contraindication may include : - Low dose oral minoxidil 1.25 - '5mg'$  daily - Spironolactone 50 - '100mg'$  bid - Finasteride 2.5 - 5 mg daily Adjunctive therapies include: - Low Level Laser Light Therapy (LLLT) - Platelet-rich plasma injections (PRP) - Hair Transplants or scalp reduction   Increase to Minoxidil  to 2.5 mg take 1 tablet daily for hair growth  Related Medications minoxidil (LONITEN) 2.5 MG tablet TAKE 1 TABLET BY MOUTH EVERY DAY for hair growth  Neoplasm of skin Left Upper Back 3.0 cm lat to spine Epidermal / dermal shaving See photos in Media Lesion diameter (cm):  0.6 Informed consent: discussed and consent obtained   Timeout: patient name, date of birth, surgical site, and procedure verified   Procedure prep:  Patient was prepped and draped in usual sterile fashion Prep type:  Isopropyl alcohol Anesthesia: the lesion was anesthetized in a standard fashion   Anesthetic:  1% lidocaine w/ epinephrine 1-100,000 buffered w/ 8.4% NaHCO3 Hemostasis achieved with: pressure, aluminum chloride and electrodesiccation   Outcome: patient tolerated procedure well   Post-procedure details: sterile dressing applied and wound care instructions given   Dressing type: bandage and petrolatum    Specimen 1 - Surgical pathology Differential Diagnosis: R/O Dysplastic nevus  Check Margins: No  Lentigines - Scattered tan macules - Due to sun exposure - Benign-appearing, observe - Recommend daily broad spectrum sunscreen SPF 30+ to sun-exposed areas, reapply every 2 hours  as needed. - Call for any changes  Seborrheic Keratoses - Stuck-on, waxy, tan-brown papules and/or plaques  - Benign-appearing - Discussed benign etiology and prognosis. - Observe - Call for any changes  Melanocytic Nevi - Tan-brown and/or pink-flesh-colored symmetric macules and papules -  Benign appearing on exam today - Observation - Call clinic for new or changing moles - Recommend daily use of broad spectrum spf 30+ sunscreen to sun-exposed areas.   Hemangiomas - Red papules - Discussed benign nature - Observe - Call for any changes  Actinic Damage - Chronic condition, secondary to cumulative UV/sun exposure - diffuse scaly erythematous macules with underlying dyspigmentation - Recommend daily broad spectrum sunscreen SPF 30+ to sun-exposed areas, reapply every 2 hours as needed.  - Staying in the shade or wearing long sleeves, sun glasses (UVA+UVB protection) and wide brim hats (4-inch brim around the entire circumference of the hat) are also recommended for sun protection.  - Call for new or changing lesions.  History of Basal Cell Carcinoma of the Skin Right chest medial infraclavicular 02/02/2021 - No evidence of recurrence today - Recommend regular full body skin exams - Recommend daily broad spectrum sunscreen SPF 30+ to sun-exposed areas, reapply every 2 hours as needed.  - Call if any new or changing lesions are noted between office visits   Skin cancer screening performed today.   Return in about 1 year (around 08/11/2022) for TBSE, hx of BCC.  IMarye Round, CMA, am acting as scribe for Sarina Ser, MD .  Documentation: I have reviewed the above documentation for accuracy and completeness, and I agree with the above.  Sarina Ser, MD

## 2021-08-10 NOTE — Patient Instructions (Addendum)

## 2021-08-16 ENCOUNTER — Telehealth: Payer: Self-pay

## 2021-08-16 NOTE — Telephone Encounter (Signed)
Unable to leave a message. Voicemail box full.

## 2021-08-16 NOTE — Telephone Encounter (Signed)
-----   Message from Ralene Bathe, MD sent at 08/12/2021  5:52 PM EDT ----- Diagnosis Skin , right upper back 3.0 cm lat to spine DYSPLASTIC JUNCTIONAL NEVUS WITH SEVERE ATYPIA, MARGIN CLOSE, SEE DESCRIPTION  Severe dysplastic Schedule surgery

## 2021-08-21 ENCOUNTER — Encounter: Payer: Self-pay | Admitting: Dermatology

## 2021-08-24 ENCOUNTER — Telehealth: Payer: Self-pay

## 2021-08-24 NOTE — Telephone Encounter (Signed)
Called [facility] spoke with Rosann Auerbach, she will correct the location on this pathology report, the correct location is left upper back 3.0 cm lat to spine. Corrected report will be faxed here today

## 2021-08-30 ENCOUNTER — Telehealth: Payer: Self-pay

## 2021-08-30 NOTE — Telephone Encounter (Signed)
Advised patient of results and scheduled her for surgery/hd

## 2021-08-30 NOTE — Telephone Encounter (Signed)
-----   Message from Ralene Bathe, MD sent at 08/26/2021  6:20 PM EDT ----- Diagnosis Skin , left upper back 3.0 cm lat to spine DYSPLASTIC JUNCTIONAL NEVUS WITH SEVERE ATYPIA, MARGIN CLOSE, SEE DESCRIPTION  Corrected report -- changed to LEFT Severe dysplastic Schedule surgery

## 2021-09-01 ENCOUNTER — Telehealth: Payer: Self-pay

## 2021-09-01 NOTE — Telephone Encounter (Signed)
Advised patient of results/hd  

## 2021-10-07 ENCOUNTER — Ambulatory Visit
Admission: RE | Admit: 2021-10-07 | Discharge: 2021-10-07 | Disposition: A | Discharge status: Home / Self Care | Payer: Commercial Managed Care - PPO | Source: Ambulatory Visit | Attending: Obstetrics and Gynecology | Admitting: Obstetrics and Gynecology

## 2021-10-07 ENCOUNTER — Encounter: Payer: Self-pay | Admitting: Radiology

## 2021-10-07 DIAGNOSIS — Z1231 Encounter for screening mammogram for malignant neoplasm of breast: Secondary | ICD-10-CM | POA: Diagnosis present

## 2021-11-03 ENCOUNTER — Telehealth: Payer: Self-pay

## 2021-11-03 NOTE — Telephone Encounter (Signed)
Patient left a VM on the nurse line today inquiring about the Minoxidil dose increase from her 08/10/21 office visit. Patient states that she has had significant hairloss since increasing dose to the 2.'5mg'$ . Patient also states that she is not noticing any new hair growth. She would like to know if this is normal or if she should go back to the lower dose 1.'25mg'$ ? Please advise

## 2021-11-04 NOTE — Telephone Encounter (Signed)
Called patient back this morning and LVM for patient to return our call to go over Dr. Alveria Apley comments and to schedule followup for increased hairloss. AF

## 2021-11-10 ENCOUNTER — Ambulatory Visit: Payer: Commercial Managed Care - PPO | Admitting: Dermatology

## 2021-11-10 DIAGNOSIS — L649 Androgenic alopecia, unspecified: Secondary | ICD-10-CM

## 2021-11-10 DIAGNOSIS — L650 Telogen effluvium: Secondary | ICD-10-CM | POA: Diagnosis not present

## 2021-11-10 NOTE — Progress Notes (Signed)
   Follow-Up Visit   Subjective  Barbara Griffin is a 56 y.o. female who presents for the following: Androgenic Alopecia (Scalp, Increased Minoxidil  to 2.'5mg'$  1 po qd on 08/10/21 and pt feels like hair shedding has increased).  The following portions of the chart were reviewed this encounter and updated as appropriate:   Tobacco  Allergies  Meds  Problems  Med Hx  Surg Hx  Fam Hx     Review of Systems:  No other skin or systemic complaints except as noted in HPI or Assessment and Plan.  Objective  Well appearing patient in no apparent distress; mood and affect are within normal limits.  A focused examination was performed including scalp. Relevant physical exam findings are noted in the Assessment and Plan.  Scalp From L brow to thicker hair is 7.0cm and 6.0cm where fine hair starts Diffuse thinning of hair                   Assessment & Plan  Androgenic alopecia With Telogen Effluvium Scalp Chronic and persistent condition with duration or expected duration over one year. Condition is bothersome/symptomatic for patient. Currently flared - 2ndary to increased dose of Minoxidil? - causing temporary shedding?  Female Androgenic Alopecia is a chronic condition related to genetics and/or hormonal changes.  In women androgenetic alopecia is commonly associated with menopause but may occur any time after puberty.  It causes hair thinning primarily on the crown with widening of the part and temporal hairline recession.  Can use OTC Rogaine (minoxidil) 5% solution/foam as directed.  Oral treatments in female patients who have no contraindication may include : - Low dose oral minoxidil 1.25 - '5mg'$  daily - Spironolactone 50 - '100mg'$  bid - Finasteride 2.5 - 5 mg daily Adjunctive therapies include: - Low Level Laser Light Therapy (LLLT) - Platelet-rich plasma injections (PRP) - Hair Transplants or scalp reduction   Labs from 08/24/21 viewed and wnl Photos viewed from  08/10/21 and see some improvement  Decrease back down to Minoxidil 2.'5mg'$  1/2 pill (1.'25mg'$ ) po qd  Discussed oral Finasteride, may consider adding in future  Related Medications minoxidil (LONITEN) 2.5 MG tablet TAKE 1 TABLET BY MOUTH EVERY DAY for hair growth  Return for as scheduled for surgery and to re-evaluate alopecia.  I, Othelia Pulling, RMA, am acting as scribe for Sarina Ser, MD . Documentation: I have reviewed the above documentation for accuracy and completeness, and I agree with the above.  Sarina Ser, MD

## 2021-11-10 NOTE — Patient Instructions (Signed)
Due to recent changes in healthcare laws, you may see results of your pathology and/or laboratory studies on MyChart before the doctors have had a chance to review them. We understand that in some cases there may be results that are confusing or concerning to you. Please understand that not all results are received at the same time and often the doctors may need to interpret multiple results in order to provide you with the best plan of care or course of treatment. Therefore, we ask that you please give us 2 business days to thoroughly review all your results before contacting the office for clarification. Should we see a critical lab result, you will be contacted sooner.   If You Need Anything After Your Visit  If you have any questions or concerns for your doctor, please call our main line at [phone] and press option 4 to reach your doctor's medical assistant. If no one answers, please leave a voicemail as directed and we will return your call as soon as possible. Messages left after 4 pm will be answered the following business day.   You may also send us a message via MyChart. We typically respond to MyChart messages within 1-2 business days.  For prescription refills, please ask your pharmacy to contact our office. Our fax number is [phone].  If you have an urgent issue when the clinic is closed that cannot wait until the next business day, you can page your doctor at the number below.    Please note that while we do our best to be available for urgent issues outside of office hours, we are not available 24/7.   If you have an urgent issue and are unable to reach us, you may choose to seek medical care at your doctor's office, retail clinic, urgent care center, or emergency room.  If you have a medical emergency, please immediately call 911 or go to the emergency department.  Pager Numbers  - Dr. Kowalski: [phone]  - Dr. Moye: [phone]  - Dr. Stewart:  [phone]  In the event of inclement weather, please call our main line at [phone] for an update on the status of any delays or closures.  Dermatology Medication Tips: Please keep the boxes that topical medications come in in order to help keep track of the instructions about where and how to use these. Pharmacies typically print the medication instructions only on the boxes and not directly on the medication tubes.   If your medication is too expensive, please contact our office at [phone] option 4 or send us a message through MyChart.   We are unable to tell what your co-pay for medications will be in advance as this is different depending on your insurance coverage. However, we may be able to find a substitute medication at lower cost or fill out paperwork to get insurance to cover a needed medication.   If a prior authorization is required to get your medication covered by your insurance company, please allow us 1-2 business days to complete this process.  Drug prices often vary depending on where the prescription is filled and some pharmacies may offer cheaper prices.  The website www.goodrx.com contains coupons for medications through different pharmacies. The prices here do not account for what the cost may be with help from insurance (it may be cheaper with your insurance), but the website can give you the price if you did not use any insurance.  - You can print the associated coupon and take it with   your prescription to the pharmacy.  - You may also stop by our office during regular business hours and pick up a GoodRx coupon card.  - If you need your prescription sent electronically to a different pharmacy, notify our office through [facility] MyChart or by phone at [phone] option 4.     Si Usted Necesita Algo Despus de Su Visita  Tambin puede enviarnos un mensaje a travs de MyChart. Por lo general respondemos a los mensajes de MyChart en el transcurso de 1 a 2  das hbiles.  Para renovar recetas, por favor pida a su farmacia que se ponga en contacto con nuestra oficina. Nuestro nmero de fax es el [phone].  Si tiene un asunto urgente cuando la clnica est cerrada y que no puede esperar hasta el siguiente da hbil, puede llamar/localizar a su doctor(a) al nmero que aparece a continuacin.   Por favor, tenga en cuenta que aunque hacemos todo lo posible para estar disponibles para asuntos urgentes fuera del horario de oficina, no estamos disponibles las 24 horas del da, los 7 das de la semana.   Si tiene un problema urgente y no puede comunicarse con nosotros, puede optar por buscar atencin mdica  en el consultorio de su doctor(a), en una clnica privada, en un centro de atencin urgente o en una sala de emergencias.  Si tiene una emergencia mdica, por favor llame inmediatamente al 911 o vaya a la sala de emergencias.  Nmeros de bper  - Dr. Kowalski: [phone]  - Dra. Moye: [phone]  - Dra. Stewart: [phone]  En caso de inclemencias del tiempo, por favor llame a nuestra lnea principal al [phone] para una actualizacin sobre el estado de cualquier retraso o cierre.  Consejos para la medicacin en dermatologa: Por favor, guarde las cajas en las que vienen los medicamentos de uso tpico para ayudarle a seguir las instrucciones sobre dnde y cmo usarlos. Las farmacias generalmente imprimen las instrucciones del medicamento slo en las cajas y no directamente en los tubos del medicamento.   Si su medicamento es muy caro, por favor, pngase en contacto con nuestra oficina llamando al [phone] y presione la opcin 4 o envenos un mensaje a travs de MyChart.   No podemos decirle cul ser su copago por los medicamentos por adelantado ya que esto es diferente dependiendo de la cobertura de su seguro. Sin embargo, es posible que podamos encontrar un medicamento sustituto a menor costo o llenar un formulario para que el  seguro cubra el medicamento que se considera necesario.   Si se requiere una autorizacin previa para que su compaa de seguros cubra su medicamento, por favor permtanos de 1 a 2 das hbiles para completar este proceso.  Los precios de los medicamentos varan con frecuencia dependiendo del lugar de dnde se surte la receta y alguna farmacias pueden ofrecer precios ms baratos.  El sitio web www.goodrx.com tiene cupones para medicamentos de diferentes farmacias. Los precios aqu no tienen en cuenta lo que podra costar con la ayuda del seguro (puede ser ms barato con su seguro), pero el sitio web puede darle el precio si no utiliz ningn seguro.  - Puede imprimir el cupn correspondiente y llevarlo con su receta a la farmacia.  - Tambin puede pasar por nuestra oficina durante el horario de atencin regular y recoger una tarjeta de cupones de GoodRx.  - Si necesita que su receta se enve electrnicamente a una farmacia diferente, informe a nuestra oficina a travs de MyChart de [facility]   o por telfono llamando al [phone] y presione la opcin 4.  

## 2021-11-17 ENCOUNTER — Encounter: Payer: Self-pay | Admitting: Dermatology

## 2022-02-08 ENCOUNTER — Encounter: Payer: Commercial Managed Care - PPO | Admitting: Dermatology

## 2022-03-08 ENCOUNTER — Encounter: Payer: Self-pay | Admitting: Dermatology

## 2022-03-08 ENCOUNTER — Ambulatory Visit (INDEPENDENT_AMBULATORY_CARE_PROVIDER_SITE_OTHER): Payer: Commercial Managed Care - PPO | Admitting: Dermatology

## 2022-03-08 DIAGNOSIS — L814 Other melanin hyperpigmentation: Secondary | ICD-10-CM | POA: Diagnosis not present

## 2022-03-08 DIAGNOSIS — D225 Melanocytic nevi of trunk: Secondary | ICD-10-CM | POA: Diagnosis not present

## 2022-03-08 DIAGNOSIS — L578 Other skin changes due to chronic exposure to nonionizing radiation: Secondary | ICD-10-CM | POA: Diagnosis not present

## 2022-03-08 DIAGNOSIS — L905 Scar conditions and fibrosis of skin: Secondary | ICD-10-CM | POA: Diagnosis not present

## 2022-03-08 DIAGNOSIS — D492 Neoplasm of unspecified behavior of bone, soft tissue, and skin: Secondary | ICD-10-CM

## 2022-03-08 DIAGNOSIS — D229 Melanocytic nevi, unspecified: Secondary | ICD-10-CM

## 2022-03-08 DIAGNOSIS — D485 Neoplasm of uncertain behavior of skin: Secondary | ICD-10-CM

## 2022-03-08 NOTE — Patient Instructions (Addendum)
Wound Care Instructions  Cleanse wound gently with soap and water once a day then pat dry with clean gauze. Apply a thin coat of Petrolatum (petroleum jelly, "Vaseline") over the wound (unless you have an allergy to this). We recommend that you use a new, sterile tube of Vaseline. Do not pick or remove scabs. Do not remove the yellow or white "healing tissue" from the base of the wound.  Cover the wound with fresh, clean, nonstick gauze and secure with paper tape. You may use Band-Aids in place of gauze and tape if the wound is small enough, but would recommend trimming much of the tape off as there is often too much. Sometimes Band-Aids can irritate the skin.  You should call the office for your biopsy report after 1 week if you have not already been contacted.  If you experience any problems, such as abnormal amounts of bleeding, swelling, significant bruising, significant pain, or evidence of infection, please call the office immediately.  FOR ADULT SURGERY PATIENTS: If you need something for pain relief you may take 1 extra strength Tylenol (acetaminophen) AND 2 Ibuprofen (200mg each) together every 4 hours as needed for pain. (do not take these if you are allergic to them or if you have a reason you should not take them.) Typically, you may only need pain medication for 1 to 3 days.     Due to recent changes in healthcare laws, you may see results of your pathology and/or laboratory studies on MyChart before the doctors have had a chance to review them. We understand that in some cases there may be results that are confusing or concerning to you. Please understand that not all results are received at the same time and often the doctors may need to interpret multiple results in order to provide you with the best plan of care or course of treatment. Therefore, we ask that you please give us 2 business days to thoroughly review all your results before contacting the office for clarification. Should  we see a critical lab result, you will be contacted sooner.   If You Need Anything After Your Visit  If you have any questions or concerns for your doctor, please call our main line at [phone] and press option 4 to reach your doctor's medical assistant. If no one answers, please leave a voicemail as directed and we will return your call as soon as possible. Messages left after 4 pm will be answered the following business day.   You may also send us a message via MyChart. We typically respond to MyChart messages within 1-2 business days.  For prescription refills, please ask your pharmacy to contact our office. Our fax number is [phone].  If you have an urgent issue when the clinic is closed that cannot wait until the next business day, you can page your doctor at the number below.    Please note that while we do our best to be available for urgent issues outside of office hours, we are not available 24/7.   If you have an urgent issue and are unable to reach us, you may choose to seek medical care at your doctor's office, retail clinic, urgent care center, or emergency room.  If you have a medical emergency, please immediately call 911 or go to the emergency department.  Pager Numbers  - Dr. Kowalski: [phone]  - Dr. Moye: [phone]  - Dr. Stewart: [phone]  In the event of inclement weather, please call our main line at   [phone] for an update on the status of any delays or closures.  Dermatology Medication Tips: Please keep the boxes that topical medications come in in order to help keep track of the instructions about where and how to use these. Pharmacies typically print the medication instructions only on the boxes and not directly on the medication tubes.   If your medication is too expensive, please contact our office at [phone] option 4 or send us a message through MyChart.   We are unable to tell what your co-pay for medications will be in  advance as this is different depending on your insurance coverage. However, we may be able to find a substitute medication at lower cost or fill out paperwork to get insurance to cover a needed medication.   If a prior authorization is required to get your medication covered by your insurance company, please allow us 1-2 business days to complete this process.  Drug prices often vary depending on where the prescription is filled and some pharmacies may offer cheaper prices.  The website www.goodrx.com contains coupons for medications through different pharmacies. The prices here do not account for what the cost may be with help from insurance (it may be cheaper with your insurance), but the website can give you the price if you did not use any insurance.  - You can print the associated coupon and take it with your prescription to the pharmacy.  - You may also stop by our office during regular business hours and pick up a GoodRx coupon card.  - If you need your prescription sent electronically to a different pharmacy, notify our office through [facility] MyChart or by phone at [phone] option 4.     Si Usted Necesita Algo Despus de Su Visita  Tambin puede enviarnos un mensaje a travs de MyChart. Por lo general respondemos a los mensajes de MyChart en el transcurso de 1 a 2 das hbiles.  Para renovar recetas, por favor pida a su farmacia que se ponga en contacto con nuestra oficina. Nuestro nmero de fax es el [phone].  Si tiene un asunto urgente cuando la clnica est cerrada y que no puede esperar hasta el siguiente da hbil, puede llamar/localizar a su doctor(a) al nmero que aparece a continuacin.   Por favor, tenga en cuenta que aunque hacemos todo lo posible para estar disponibles para asuntos urgentes fuera del horario de oficina, no estamos disponibles las 24 horas del da, los 7 das de la semana.   Si tiene un problema urgente y no puede comunicarse con nosotros, puede  optar por buscar atencin mdica  en el consultorio de su doctor(a), en una clnica privada, en un centro de atencin urgente o en una sala de emergencias.  Si tiene una emergencia mdica, por favor llame inmediatamente al 911 o vaya a la sala de emergencias.  Nmeros de bper  - Dr. Kowalski: [phone]  - Dra. Moye: [phone]  - Dra. Stewart: [phone]  En caso de inclemencias del tiempo, por favor llame a nuestra lnea principal al [phone] para una actualizacin sobre el estado de cualquier retraso o cierre.  Consejos para la medicacin en dermatologa: Por favor, guarde las cajas en las que vienen los medicamentos de uso tpico para ayudarle a seguir las instrucciones sobre dnde y cmo usarlos. Las farmacias generalmente imprimen las instrucciones del medicamento slo en las cajas y no directamente en los tubos del medicamento.   Si su medicamento es muy caro, por favor, pngase en contacto con   nuestra oficina llamando al [phone] y presione la opcin 4 o envenos un mensaje a travs de MyChart.   No podemos decirle cul ser su copago por los medicamentos por adelantado ya que esto es diferente dependiendo de la cobertura de su seguro. Sin embargo, es posible que podamos encontrar un medicamento sustituto a menor costo o llenar un formulario para que el seguro cubra el medicamento que se considera necesario.   Si se requiere una autorizacin previa para que su compaa de seguros cubra su medicamento, por favor permtanos de 1 a 2 das hbiles para completar este proceso.  Los precios de los medicamentos varan con frecuencia dependiendo del lugar de dnde se surte la receta y alguna farmacias pueden ofrecer precios ms baratos.  El sitio web www.goodrx.com tiene cupones para medicamentos de diferentes farmacias. Los precios aqu no tienen en cuenta lo que podra costar con la ayuda del seguro (puede ser ms barato con su seguro), pero el sitio web puede darle el  precio si no utiliz ningn seguro.  - Puede imprimir el cupn correspondiente y llevarlo con su receta a la farmacia.  - Tambin puede pasar por nuestra oficina durante el horario de atencin regular y recoger una tarjeta de cupones de GoodRx.  - Si necesita que su receta se enve electrnicamente a una farmacia diferente, informe a nuestra oficina a travs de MyChart de [place] o por telfono llamando al [phone] y presione la opcin 4.  

## 2022-03-08 NOTE — Progress Notes (Signed)
Follow-Up Visit   Subjective  Barbara Griffin is a 56 y.o. female who presents for the following: Severe Dysplastic nevus bx proven (left upper back 3.0 cm lat to spine, pt presents for removal today. The patient has spots, moles and lesions to be evaluated, some may be new or changing and the patient has concerns that these could be cancer.  The following portions of the chart were reviewed this encounter and updated as appropriate:   Tobacco  Allergies  Meds  Problems  Med Hx  Surg Hx  Fam Hx     Review of Systems:  No other skin or systemic complaints except as noted in HPI or Assessment and Plan.  Objective  Well appearing patient in no apparent distress; mood and affect are within normal limits.  A focused examination was performed including back. Relevant physical exam findings are noted in the Assessment and Plan.  left upper back 3.0 cm lat to spine Pink bx site  Right mid back braline 0.4 black macule        Assessment & Plan   Actinic Damage - chronic, secondary to cumulative UV radiation exposure/sun exposure over time - diffuse scaly erythematous macules with underlying dyspigmentation - Recommend daily broad spectrum sunscreen SPF 30+ to sun-exposed areas, reapply every 2 hours as needed.  - Recommend staying in the shade or wearing long sleeves, sun glasses (UVA+UVB protection) and wide brim hats (4-inch brim around the entire circumference of the hat). - Call for new or changing lesions.  Lentigines - Scattered tan macules - Due to sun exposure - Benign-appearing, observe - Recommend daily broad spectrum sunscreen SPF 30+ to sun-exposed areas, reapply every 2 hours as needed. - Call for any changes  Neoplasm of skin left upper back 3.0 cm lat to spine  Epidermal / dermal shaving  Lesion diameter (cm):  1.2 Informed consent: discussed and consent obtained   Timeout: patient name, date of birth, surgical site, and procedure verified    Procedure prep:  Patient was prepped and draped in usual sterile fashion Prep type:  Isopropyl alcohol Anesthesia: the lesion was anesthetized in a standard fashion   Anesthetic:  1% lidocaine w/ epinephrine 1-100,000 buffered w/ 8.4% NaHCO3 Instrument used: flexible razor blade   Hemostasis achieved with: pressure, aluminum chloride and electrodesiccation   Outcome: patient tolerated procedure well   Post-procedure details: sterile dressing applied and wound care instructions given   Dressing type: bandage and petrolatum    Specimen 1 - Surgical pathology Differential Diagnosis: D48.5 Bx proven Severe Dysplastic Nevus Check Margins: yes [phone]  Bx proven Severe Dysplastic nevus, excised today Start Mupirocin oint qd to excision site  Neoplasm of uncertain behavior of skin Right mid back braline  Epidermal / dermal shaving  Lesion diameter (cm):  0.4 Informed consent: discussed and consent obtained   Timeout: patient name, date of birth, surgical site, and procedure verified   Procedure prep:  Patient was prepped and draped in usual sterile fashion Prep type:  Isopropyl alcohol Anesthesia: the lesion was anesthetized in a standard fashion   Anesthetic:  1% lidocaine w/ epinephrine 1-100,000 buffered w/ 8.4% NaHCO3 Instrument used: flexible razor blade   Hemostasis achieved with: pressure, aluminum chloride and electrodesiccation   Outcome: patient tolerated procedure well   Post-procedure details: sterile dressing applied and wound care instructions given   Dressing type: bandage and petrolatum    Specimen 2 - Surgical pathology Differential Diagnosis: Nevus vs dysplastic nevus  Check Margins: No   Return  in about 5 months (around 08/07/2022) for TBSE.  I, Ashok Cordia, CMA, am acting as scribe for Sarina Ser, MD . Documentation: I have reviewed the above documentation for accuracy and completeness, and I agree with the above.  Sarina Ser, MD

## 2022-03-09 NOTE — Telephone Encounter (Signed)
Error

## 2022-03-15 ENCOUNTER — Encounter: Payer: Self-pay | Admitting: Dermatology

## 2022-03-15 ENCOUNTER — Telehealth: Payer: Self-pay

## 2022-03-15 NOTE — Telephone Encounter (Signed)
Patient informed of pathology results 

## 2022-03-15 NOTE — Telephone Encounter (Signed)
-----   Message from Ralene Bathe, MD sent at 03/15/2022  1:06 PM EST ----- Diagnosis 1. Skin (M), left upper back 3.0 cm lat to spine RESHAVE, SCAR, NO RESIDUAL DYSPLASTIC NEVUS 2. Skin (M), right mid back braline DYSPLASTIC COMPOUND NEVUS WITH MODERATE ATYPIA, IRRITATED, LIMITED MARGINS FREE  1- Site of severe dysplastic nevus Margins clear Recheck next visit 2- Moderate dysplastic Recheck next visit

## 2022-05-18 ENCOUNTER — Other Ambulatory Visit: Payer: Self-pay | Admitting: Obstetrics and Gynecology

## 2022-05-18 DIAGNOSIS — Z1231 Encounter for screening mammogram for malignant neoplasm of breast: Secondary | ICD-10-CM

## 2022-08-18 ENCOUNTER — Ambulatory Visit: Payer: Commercial Managed Care - PPO | Admitting: Dermatology

## 2022-10-10 ENCOUNTER — Ambulatory Visit
Admission: RE | Admit: 2022-10-10 | Discharge: 2022-10-10 | Disposition: A | Discharge status: Home / Self Care | Payer: Commercial Managed Care - PPO | Source: Ambulatory Visit | Attending: Obstetrics and Gynecology | Admitting: Obstetrics and Gynecology

## 2022-10-10 DIAGNOSIS — Z1231 Encounter for screening mammogram for malignant neoplasm of breast: Secondary | ICD-10-CM | POA: Insufficient documentation

## 2022-10-29 ENCOUNTER — Other Ambulatory Visit: Payer: Self-pay | Admitting: Dermatology

## 2022-10-29 DIAGNOSIS — L649 Androgenic alopecia, unspecified: Secondary | ICD-10-CM

## 2022-11-02 ENCOUNTER — Ambulatory Visit: Payer: Commercial Managed Care - PPO | Admitting: Dermatology

## 2022-11-02 DIAGNOSIS — D229 Melanocytic nevi, unspecified: Secondary | ICD-10-CM

## 2022-11-02 DIAGNOSIS — L814 Other melanin hyperpigmentation: Secondary | ICD-10-CM | POA: Diagnosis not present

## 2022-11-02 DIAGNOSIS — L578 Other skin changes due to chronic exposure to nonionizing radiation: Secondary | ICD-10-CM | POA: Diagnosis not present

## 2022-11-02 DIAGNOSIS — Z207 Contact with and (suspected) exposure to pediculosis, acariasis and other infestations: Secondary | ICD-10-CM

## 2022-11-02 DIAGNOSIS — W908XXA Exposure to other nonionizing radiation, initial encounter: Secondary | ICD-10-CM | POA: Diagnosis not present

## 2022-11-02 DIAGNOSIS — Z7189 Other specified counseling: Secondary | ICD-10-CM

## 2022-11-02 DIAGNOSIS — Z1283 Encounter for screening for malignant neoplasm of skin: Secondary | ICD-10-CM | POA: Diagnosis not present

## 2022-11-02 DIAGNOSIS — Z86018 Personal history of other benign neoplasm: Secondary | ICD-10-CM

## 2022-11-02 DIAGNOSIS — Z85828 Personal history of other malignant neoplasm of skin: Secondary | ICD-10-CM

## 2022-11-02 DIAGNOSIS — D1801 Hemangioma of skin and subcutaneous tissue: Secondary | ICD-10-CM

## 2022-11-02 DIAGNOSIS — L816 Other disorders of diminished melanin formation: Secondary | ICD-10-CM

## 2022-11-02 DIAGNOSIS — Z79899 Other long term (current) drug therapy: Secondary | ICD-10-CM

## 2022-11-02 DIAGNOSIS — L821 Other seborrheic keratosis: Secondary | ICD-10-CM

## 2022-11-02 MED ORDER — PERMETHRIN 5 % EX CREA
TOPICAL_CREAM | CUTANEOUS | 1 refills | Status: AC
Start: 2022-11-02 — End: ?

## 2022-11-02 NOTE — Progress Notes (Signed)
Follow-Up Visit   Subjective  Barbara Griffin is a 57 y.o. female who presents for the following: Skin Cancer Screening and Full Body Skin Exam hx of bcc, hx of dysplastic  Patient reports came in contact with son who was treated with scabies recently. Would like to discuss preventive treatment   The patient presents for Total-Body Skin Exam (TBSE) for skin cancer screening and mole check. The patient has spots, moles and lesions to be evaluated, some may be new or changing and the patient may have concern these could be cancer.    The following portions of the chart were reviewed this encounter and updated as appropriate: medications, allergies, medical history  Review of Systems:  No other skin or systemic complaints except as noted in HPI or Assessment and Plan.  Objective  Well appearing patient in no apparent distress; mood and affect are within normal limits.  A full examination was performed including scalp, head, eyes, ears, nose, lips, neck, chest, axillae, abdomen, back, buttocks, bilateral upper extremities, bilateral lower extremities, hands, feet, fingers, toes, fingernails, and toenails. All findings within normal limits unless otherwise noted below.   Relevant physical exam findings are noted in the Assessment and Plan.  body Clear at exam    Assessment & Plan   SKIN CANCER SCREENING PERFORMED TODAY.  ACTINIC DAMAGE - Chronic condition, secondary to cumulative UV/sun exposure - diffuse scaly erythematous macules with underlying dyspigmentation - Recommend daily broad spectrum sunscreen SPF 30+ to sun-exposed areas, reapply every 2 hours as needed.  - Staying in the shade or wearing long sleeves, sun glasses (UVA+UVB protection) and wide brim hats (4-inch brim around the entire circumference of the hat) are also recommended for sun protection.  - Call for new or changing lesions.  LENTIGINES, SEBORRHEIC KERATOSES, HEMANGIOMAS - Benign normal skin lesions -  Benign-appearing - Call for any changes  MELANOCYTIC NEVI - Tan-brown and/or pink-flesh-colored symmetric macules and papules - Benign appearing on exam today - Observation - Call clinic for new or changing moles - Recommend daily use of broad spectrum spf 30+ sunscreen to sun-exposed areas.   HISTORY OF BASAL CELL CARCINOMA OF THE SKIN - No evidence of recurrence today - Recommend regular full body skin exams - Recommend daily broad spectrum sunscreen SPF 30+ to sun-exposed areas, reapply every 2 hours as needed.  - Call if any new or changing lesions are noted between office visits  HISTORY OF DYSPLASTIC NEVUS No evidence of recurrence today Recommend regular full body skin exams Recommend daily broad spectrum sunscreen SPF 30+ to sun-exposed areas, reapply every 2 hours as needed.  Call if any new or changing lesions are noted between office visits  Idiopathic Guttate Hypomelanosis (IGH)  Exam: scattered small hypopigmented macules  IGH is a benign chronic condition of sun-exposed skin, most commonly affecting the arms and legs. Cause is unknown but it can be a genetic trait. It is not related to vitiligo. There is no treatment. Recommend photoprotection and regular use of spf 30 or higher sunscreen which may help prevent the development of more white spots.  Treatment Plan:  Benign, observe.     Scabies exposure body  Patient came into contact with son who was diagnosed with scabies  Will prescribe Permethrin 5 % cream - apply topically once to all skin from the neck to feet including toes at bedtime. Wash off next day. Repeat process 1 week later.  60 gram with 1 refill   Husband - Barbara Griffin dob 08/08/1964  was also sent same rx to [facility] [address]   Robbie Griffin information included in patient handout    permethrin (ELIMITE) 5 % cream - body Apply once topically to skin from neck down to feet (apply to all skin) at night before bed and wash off next day. Repeat same  process in 1 week.   Return in about 1 year (around 11/02/2023) for TBSE.  IAsher Griffin, CMA, am acting as scribe for Barbara Sans, MD.   Documentation: I have reviewed the above documentation for accuracy and completeness, and I agree with the above.  Barbara Sans, MD

## 2022-11-02 NOTE — Patient Instructions (Addendum)
Start premethrin cream - apply topically to skin from neck down to feet ( all of skin) at bedtime. Wash off next day. Repeat process in 1 week.      Scabies, Adult  Scabies is a skin condition that happens when very small insects called mites get under your skin. This causes severe itchiness and a rash that looks like pimples. Scabies is contagious. This means it can spread easily from person to person. If you get scabies, the people you live with may get it too. With the right treatment, symptoms often go away in 2-4 weeks. In most cases, scabies does not cause lasting problems. What are the causes? Scabies is caused by tiny mites (Sarcoptes scabiei) that can only be seen with a microscope. The mites get into the top layer of your skin and lay eggs. This is called an infestation. You may get scabies if: You have close contact with someone who has scabies. You come in contact with items that have the mites on them. These may include towels, bedding, or clothes. What increases the risk? You may be more likely to get scabies if: You live in a nursing home or extended care facility. You spend time in a place where a lot of people live close together, such as a shelter or prison. You have sex with a partner who has scabies. You care for others who are at risk for scabies. What are the signs or symptoms? Symptoms of scabies include: A rash that looks like pimples. It may include tiny red bumps or blisters. It is often found in the skinfolds or on the hands, wrists, elbows, armpits, chest, waist, groin, or buttocks. Severe itchiness. This is often worse at night. Skin irritation. This can include scaly patches or sores. The bumps from scabies may form a line (burrow) on the skin. The line may look thin, crooked, and grayish-white or skin colored. How is this diagnosed? Scabies may be diagnosed based on a physical exam of your skin. You may also have a skin test done. A sample of your skin may be  taken (skin scraping) and looked at under a microscope for signs of mites. How is this treated? Scabies may be treated with: Medicated creams or lotions to kill the mites. The cream or lotion is spread on your whole body and left for a few hours. In most cases, one treatment is enough to kill all the mites. In severe cases, the treatment may need to be done more than once. Medicated cream to help with the itching. Medicines taken by mouth (orally). These may help: Relieve itching. Reduce the swelling and redness. Kill the mites. This treatment may be used in severe cases. Follow these instructions at home: Medicines Take or apply over-the-counter and prescription medicines only as told by your health care provider. Apply medicated cream or lotion as told by your provider. Do not wash off the medicated cream or lotion until enough time has passed or as told by your provider. Skin care Try not to scratch or pick at the affected areas of your skin. Keep your fingernails closely trimmed. This can help reduce injury from scratching. Take cool baths or apply cool, wet cloths to your skin. This can help reduce itching. General instructions Clean all items that you touched in the 3 days before you were diagnosed. This includes bedding, clothes, towels, and furniture. Do this on the same day that you start treatment. Dry-clean items or use hot water to wash them. Dry them  on the hot dry cycle. Place items that cannot be washed into closed, airtight plastic bags for at least 3 days. The mites cannot live for more than 3 days away from human skin. Vacuum your furniture and mattresses. Make sure that other people who may have been infested see a provider. Where to find more information Centers for Disease Control and Prevention (CDC): TonerPromos.no Contact a health care provider if: You have itching that does not go away after 4 weeks of treatment. You keep getting new bumps or burrows. You have redness,  swelling, or pain near your rash after treatment. You have fluid, blood, or pus coming from your rash. You get thick crusts or scaly patches over large areas of your skin. You have a fever. This information is not intended to replace advice given to you by your health care provider. Make sure you discuss any questions you have with your health care provider. Document Revised: 12/20/2021 Document Reviewed: 12/20/2021 Elsevier Patient Education  2024 Elsevier Inc.    Melanoma ABCDEs  Melanoma is the most dangerous type of skin cancer, and is the leading cause of death from skin disease.  You are more likely to develop melanoma if you: Have light-colored skin, light-colored eyes, or red or blond hair Spend a lot of time in the sun Tan regularly, either outdoors or in a tanning bed Have had blistering sunburns, especially during childhood Have a close family member who has had a melanoma Have atypical moles or large birthmarks  Early detection of melanoma is key since treatment is typically straightforward and cure rates are extremely high if we catch it early.   The first sign of melanoma is often a change in a mole or a new dark spot.  The ABCDE system is a way of remembering the signs of melanoma.  A for asymmetry:  The two halves do not match. B for border:  The edges of the growth are irregular. C for color:  A mixture of colors are present instead of an even brown color. D for diameter:  Melanomas are usually (but not always) greater than 6mm - the size of a pencil eraser. E for evolution:  The spot keeps changing in size, shape, and color.  Please check your skin once per month between visits. You can use a small mirror in front and a large mirror behind you to keep an eye on the back side or your body.   If you see any new or changing lesions before your next follow-up, please call to schedule a visit.  Please continue daily skin protection including broad spectrum sunscreen  SPF 30+ to sun-exposed areas, reapplying every 2 hours as needed when you're outdoors.   Staying in the shade or wearing long sleeves, sun glasses (UVA+UVB protection) and wide brim hats (4-inch brim around the entire circumference of the hat) are also recommended for sun protection.       Due to recent changes in healthcare laws, you may see results of your pathology and/or laboratory studies on MyChart before the doctors have had a chance to review them. We understand that in some cases there may be results that are confusing or concerning to you. Please understand that not all results are received at the same time and often the doctors may need to interpret multiple results in order to provide you with the best plan of care or course of treatment. Therefore, we ask that you please give [place] 2 business days to thoroughly review all  your results before contacting the office for clarification. Should we see a critical lab result, you will be contacted sooner.   If You Need Anything After Your Visit  If you have any questions or concerns for your doctor, please call our main line at [phone] and press option 4 to reach your doctor's medical assistant. If no one answers, please leave a voicemail as directed and we will return your call as soon as possible. Messages left after 4 pm will be answered the following business day.   You may also send [place] a message via MyChart. We typically respond to MyChart messages within 1-2 business days.  For prescription refills, please ask your pharmacy to contact our office. Our fax number is [phone].  If you have an urgent issue when the clinic is closed that cannot wait until the next business day, you can page your doctor at the number below.    Please note that while we do our best to be available for urgent issues outside of office hours, we are not available 24/7.   If you have an urgent issue and are unable to reach [place], you may choose to seek  medical care at your doctor's office, retail clinic, urgent care center, or emergency room.  If you have a medical emergency, please immediately call 911 or go to the emergency department.  Pager Numbers  - Dr. Gwen Pounds: [phone]  - Dr. Roseanne Reno: [phone]  In the event of inclement weather, please call our main line at [phone] for an update on the status of any delays or closures.  Dermatology Medication Tips: Please keep the boxes that topical medications come in in order to help keep track of the instructions about where and how to use these. Pharmacies typically print the medication instructions only on the boxes and not directly on the medication tubes.   If your medication is too expensive, please contact our office at [phone] option 4 or send [place] a message through MyChart.   We are unable to tell what your co-pay for medications will be in advance as this is different depending on your insurance coverage. However, we may be able to find a substitute medication at lower cost or fill out paperwork to get insurance to cover a needed medication.   If a prior authorization is required to get your medication covered by your insurance company, please allow [place] 1-2 business days to complete this process.  Drug prices often vary depending on where the prescription is filled and some pharmacies may offer cheaper prices.  The website www.goodrx.com contains coupons for medications through different pharmacies. The prices here do not account for what the cost may be with help from insurance (it may be cheaper with your insurance), but the website can give you the price if you did not use any insurance.  - You can print the associated coupon and take it with your prescription to the pharmacy.  - You may also stop by our office during regular business hours and pick up a GoodRx coupon card.  - If you need your prescription sent electronically to a different pharmacy, notify our  office through [facility] or by phone at [phone] option 4.     Si Usted Necesita Algo Despus de Su Visita  Tambin puede enviarnos un mensaje a travs de Clinical cytogeneticist. Por lo general respondemos a los mensajes de MyChart en el transcurso de 1 a 2 das hbiles.  Para renovar recetas, por favor pida a su farmacia  que se ponga en contacto con nuestra oficina. Annie Sable de fax es [place] [phone].  Si tiene un asunto urgente cuando la clnica est cerrada y que no puede esperar hasta el siguiente da hbil, puede llamar/localizar a su doctor(a) al nmero que aparece a continuacin.   Por favor, tenga en cuenta que aunque hacemos todo lo posible para estar disponibles para asuntos urgentes fuera del horario de [place], no estamos disponibles las 24 horas del da, los 7 [address]  [address] de la [place].   Si tiene un problema urgente y no puede comunicarse con nosotros, puede optar por buscar atencin mdica  en el consultorio de su doctor(a), en una clnica privada, en un centro de atencin urgente o en una sala de emergencias.  Si tiene Engineer, drilling, por favor llame inmediatamente al 911 o vaya a la sala de emergencias.  Nmeros de bper  - Dr. Gwen Pounds: [phone]  - Dra. Roseanne Reno: [phone]  En caso de inclemencias del [place], por favor llame a Lacy Duverney principal al [phone] para una actualizacin sobre el [place] de cualquier retraso o cierre.  Consejos para la medicacin en dermatologa: Por favor, guarde las cajas en las que vienen los medicamentos de uso tpico para ayudarle a seguir las instrucciones sobre dnde y cmo usarlos. Las farmacias generalmente imprimen las instrucciones del medicamento slo en las cajas y no directamente en los tubos del [place].   Si su medicamento es muy caro, por favor, pngase en contacto con Rolm Gala llamando al [phone] y presione la opcin 4 o envenos un mensaje a travs de Clinical cytogeneticist.   No podemos decirle cul  ser su copago por los medicamentos por adelantado ya que esto es diferente dependiendo de la cobertura de su seguro. Sin embargo, es posible que podamos encontrar un medicamento sustituto a Audiological scientist un formulario para que el seguro cubra el medicamento que se considera necesario.   Si se requiere una autorizacin previa para que su compaa de seguros [place] su medicamento, por favor permtanos de 1 a 2 das hbiles para completar [address]  Los precios de los medicamentos varan con frecuencia dependiendo del Environmental consultant de dnde se surte la receta y alguna farmacias pueden ofrecer precios ms baratos.  El sitio web www.goodrx.com tiene cupones para medicamentos de Health and safety inspector. Los precios aqu no tienen en cuenta lo que podra costar con la ayuda del seguro (puede ser ms barato con su seguro), pero el sitio web puede darle el precio si no utiliz Tourist information centre manager.  - Puede imprimir el cupn correspondiente y llevarlo con su receta a la farmacia.  - Tambin puede pasar por nuestra oficina durante el horario de atencin regular y Education officer, museum una tarjeta de cupones de GoodRx.  - Si necesita que su receta se enve electrnicamente a una farmacia diferente, informe a nuestra oficina a travs de MyChart de [place] o por telfono llamando al [phone] y presione la opcin 4.

## 2022-11-11 ENCOUNTER — Encounter: Payer: Self-pay | Admitting: Dermatology

## 2023-02-08 ENCOUNTER — Other Ambulatory Visit: Payer: Self-pay | Admitting: Dermatology

## 2023-02-08 DIAGNOSIS — L649 Androgenic alopecia, unspecified: Secondary | ICD-10-CM

## 2023-05-25 ENCOUNTER — Other Ambulatory Visit: Payer: Self-pay | Admitting: Obstetrics and Gynecology

## 2023-05-25 DIAGNOSIS — Z1231 Encounter for screening mammogram for malignant neoplasm of breast: Secondary | ICD-10-CM

## 2023-07-05 IMAGING — MG MM DIGITAL SCREENING BILAT W/ TOMO AND CAD
8 series · 9 of 24 positions shown · non-contrast
Comparison: Previous exam(s).

CLINICAL DATA: Screening.

EXAM:
DIGITAL SCREENING BILATERAL MAMMOGRAM WITH TOMOSYNTHESIS AND CAD
TECHNIQUE: Bilateral screening digital craniocaudal and mediolateral oblique
mammograms were obtained. Bilateral screening digital breast
tomosynthesis was performed. The images were evaluated with
computer-aided detection.

[L MLO synth-2D]
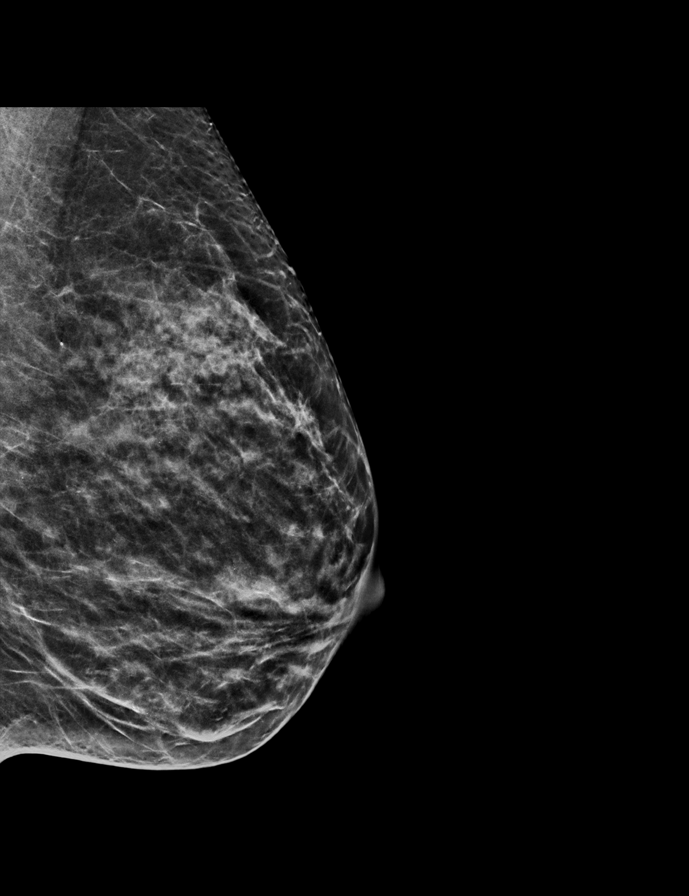

[R CC synth-2D]
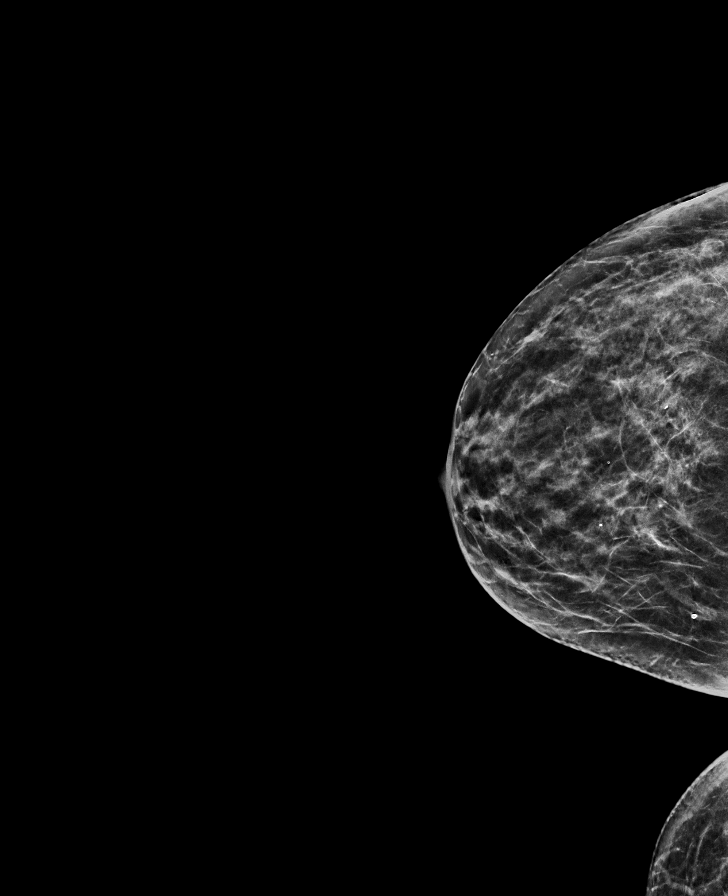

[L CC synth-2D]
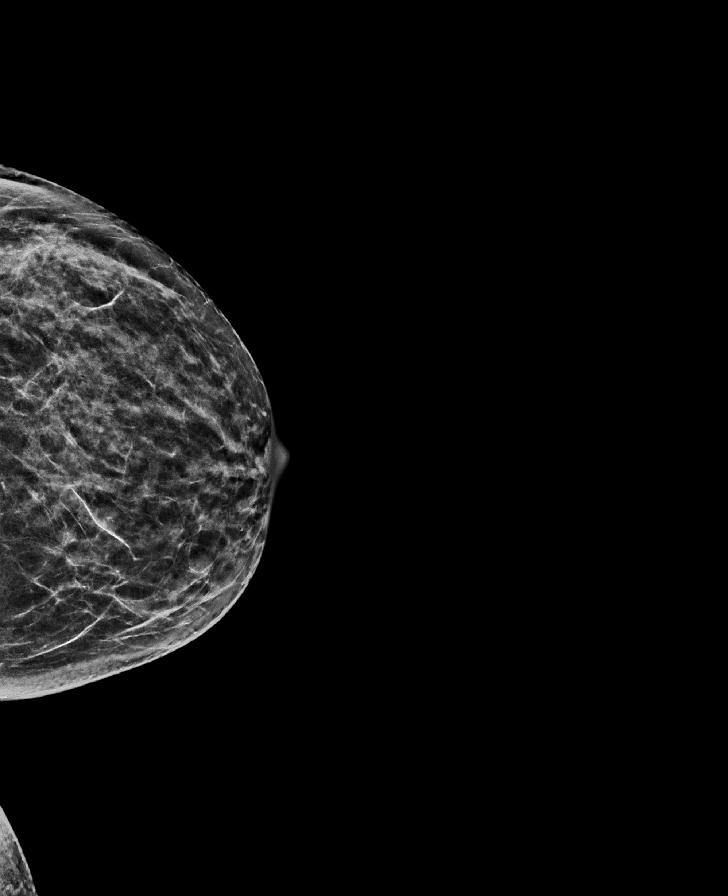

[R MLO synth-2D]
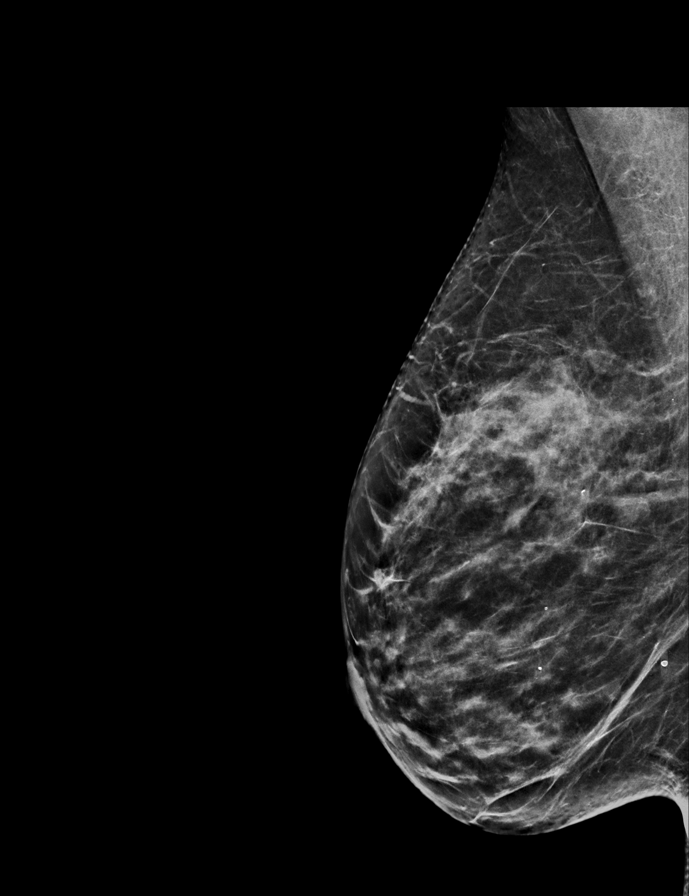

[R MLO tomo · 2 of 63 frames shown]
[frame 21/63]
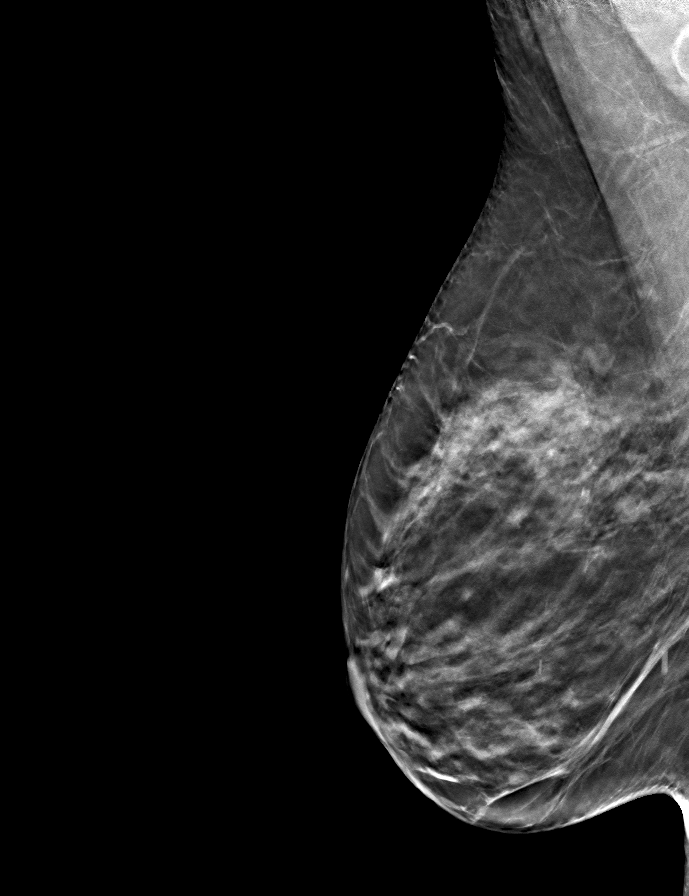
[frame 32/63]
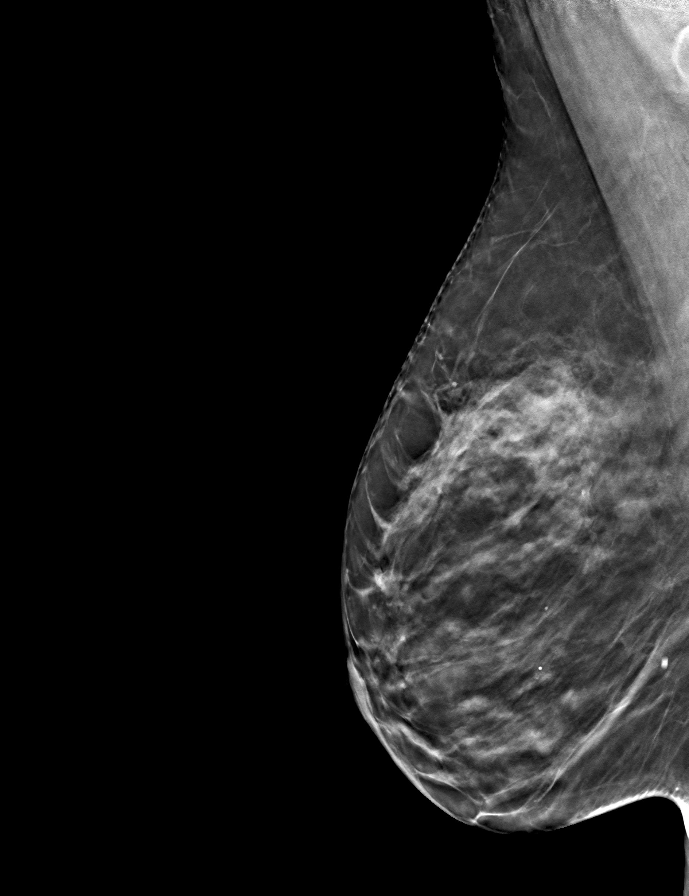

[R CC tomo · tomo slice 31/60.0]
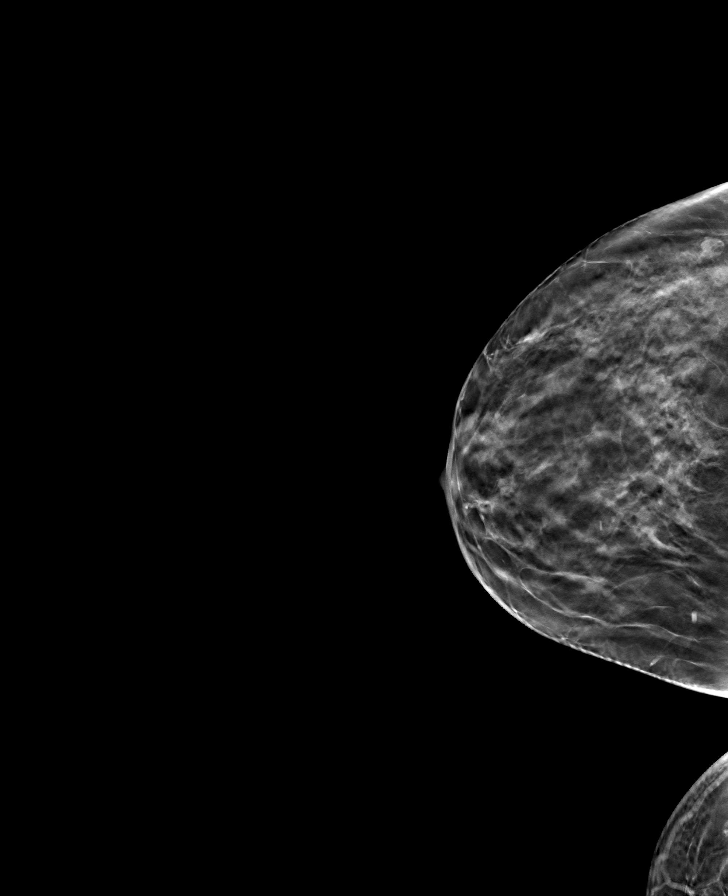

[L MLO tomo · tomo slice 29/58.0]
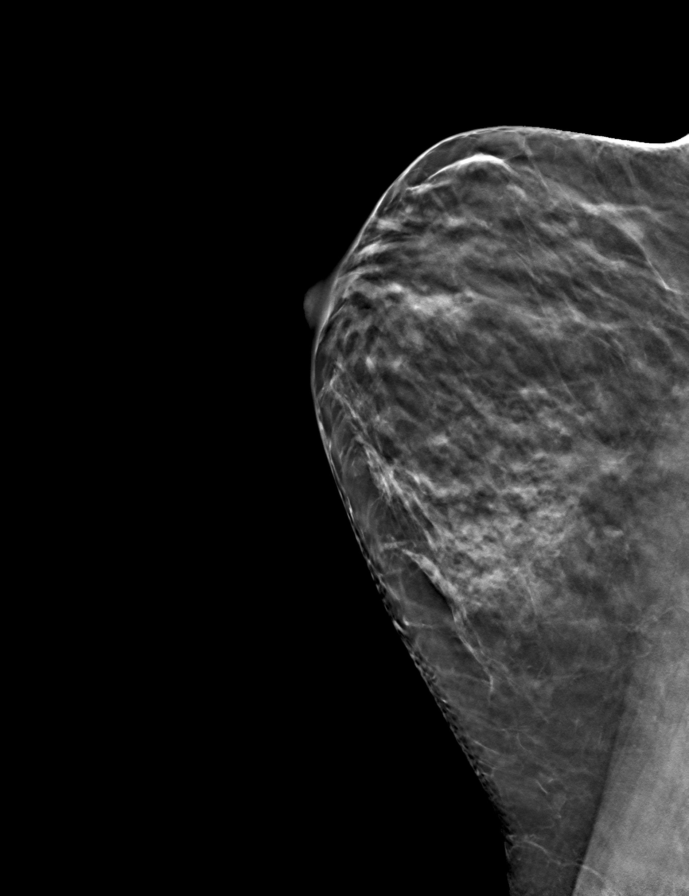

[L CC tomo · tomo slice 29/58.0]
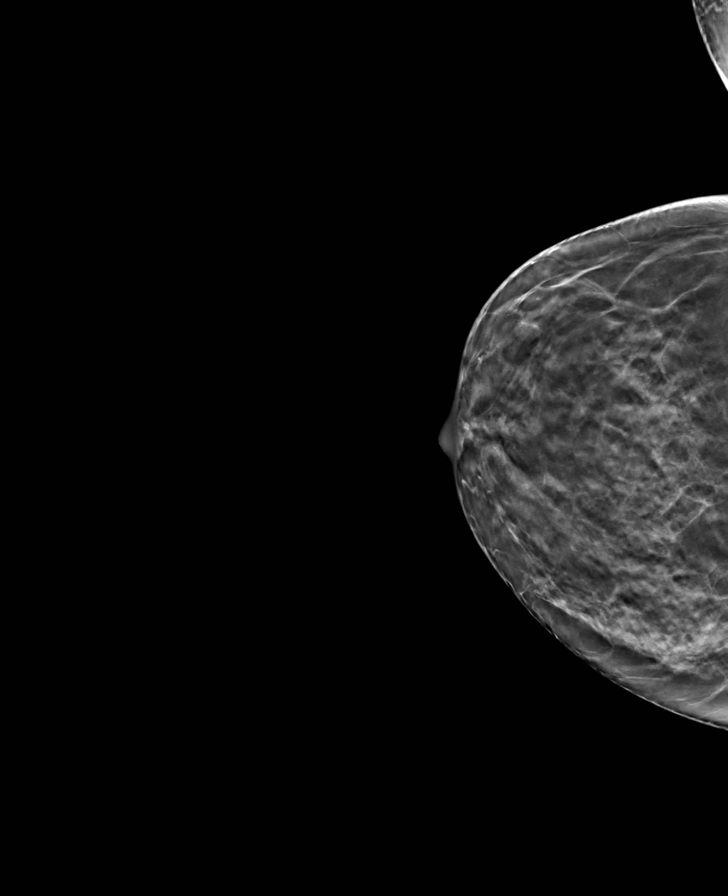

[9 of 24 positions shown; findings below may reference images not displayed]

ACR Breast Density Category c: The breast tissue is heterogeneously
dense, which may obscure small masses.
FINDINGS: There are no findings suspicious for malignancy.
IMPRESSION: No mammographic evidence of malignancy. A result letter of this
screening mammogram will be mailed directly to the patient.

RECOMMENDATION:
Screening mammogram in one year. (Code:Q3-W-BC3)

BI-RADS CATEGORY  1: Negative.

## 2023-10-05 ENCOUNTER — Other Ambulatory Visit: Payer: Self-pay | Admitting: Medical Genetics

## 2023-10-11 ENCOUNTER — Ambulatory Visit
Admission: RE | Admit: 2023-10-11 | Discharge: 2023-10-11 | Disposition: A | Discharge status: Home / Self Care | Source: Ambulatory Visit | Attending: Obstetrics and Gynecology | Admitting: Obstetrics and Gynecology

## 2023-10-11 DIAGNOSIS — Z1231 Encounter for screening mammogram for malignant neoplasm of breast: Secondary | ICD-10-CM | POA: Diagnosis present

## 2023-10-19 ENCOUNTER — Other Ambulatory Visit

## 2023-10-19 DIAGNOSIS — Z006 Encounter for examination for normal comparison and control in clinical research program: Secondary | ICD-10-CM

## 2023-11-04 LAB — GENECONNECT MOLECULAR SCREEN: Genetic Analysis Overall Interpretation: NEGATIVE

## 2023-11-22 ENCOUNTER — Ambulatory Visit: Payer: Commercial Managed Care - PPO | Admitting: Dermatology

## 2023-12-05 ENCOUNTER — Ambulatory Visit: Admitting: Dermatology

## 2023-12-05 ENCOUNTER — Encounter: Payer: Self-pay | Admitting: Dermatology

## 2023-12-05 DIAGNOSIS — L649 Androgenic alopecia, unspecified: Secondary | ICD-10-CM | POA: Diagnosis not present

## 2023-12-05 DIAGNOSIS — W908XXA Exposure to other nonionizing radiation, initial encounter: Secondary | ICD-10-CM

## 2023-12-05 DIAGNOSIS — Z85828 Personal history of other malignant neoplasm of skin: Secondary | ICD-10-CM

## 2023-12-05 DIAGNOSIS — L814 Other melanin hyperpigmentation: Secondary | ICD-10-CM

## 2023-12-05 DIAGNOSIS — Z86018 Personal history of other benign neoplasm: Secondary | ICD-10-CM

## 2023-12-05 DIAGNOSIS — L820 Inflamed seborrheic keratosis: Secondary | ICD-10-CM

## 2023-12-05 DIAGNOSIS — Z1283 Encounter for screening for malignant neoplasm of skin: Secondary | ICD-10-CM | POA: Diagnosis not present

## 2023-12-05 DIAGNOSIS — D485 Neoplasm of uncertain behavior of skin: Secondary | ICD-10-CM

## 2023-12-05 DIAGNOSIS — L821 Other seborrheic keratosis: Secondary | ICD-10-CM

## 2023-12-05 DIAGNOSIS — D489 Neoplasm of uncertain behavior, unspecified: Secondary | ICD-10-CM

## 2023-12-05 DIAGNOSIS — D229 Melanocytic nevi, unspecified: Secondary | ICD-10-CM

## 2023-12-05 DIAGNOSIS — L578 Other skin changes due to chronic exposure to nonionizing radiation: Secondary | ICD-10-CM

## 2023-12-05 DIAGNOSIS — L650 Telogen effluvium: Secondary | ICD-10-CM | POA: Diagnosis not present

## 2023-12-05 DIAGNOSIS — D225 Melanocytic nevi of trunk: Secondary | ICD-10-CM | POA: Diagnosis not present

## 2023-12-05 MED ORDER — MINOXIDIL 2.5 MG PO TABS
ORAL_TABLET | ORAL | 3 refills | Status: AC
Start: 2023-12-05 — End: ?

## 2023-12-05 NOTE — Progress Notes (Unsigned)
 Follow-Up Visit   Subjective  Barbara Griffin is a 58 y.o. 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 No areas of concern.  Wants to disscuss her hairloss. She is not experiences as much hair loss now. Last treatment was a half tablet of minoxidil  daily which was 1.25. Still currently taking 1.25 minoxidil . Wants to know if she needs to continue 1.25 or go up to 5mg    The patient presents for Total-Body Skin Exam (TBSE) for skin cancer screening and mole check. The patient has spots, moles and lesions to be evaluated, some may be new or changing and the patient may have concern these could be cancer.  The following portions of the chart were reviewed this encounter and updated as appropriate: medications, allergies, medical history  Review of Systems:  No other skin or systemic complaints except as noted in HPI or Assessment and Plan.  Objective  Well appearing patient in no apparent distress; mood and affect are within normal limits.  A full examination was performed including scalp, head, eyes, ears, nose, lips, neck, chest, axillae, abdomen, back, buttocks, bilateral upper extremities, bilateral lower extremities, hands, feet, fingers, toes, fingernails, and toenails. All findings within normal limits unless otherwise noted below.   Relevant physical exam findings are noted in the Assessment and Plan.  Right Supraclavicular Area (2) Stuck-on, waxy, tan-brown papule or plaque --Discussed benign etiology and prognosis.  Right Inframammary Fold 0.6 irregular brown macule  Right lower back lateral above the posterior lateral waist line 0.6 irregular brown macule   Assessment & Plan   SKIN CANCER SCREENING PERFORMED TODAY.  ACTINIC DAMAGE - Chronic condition, secondary to cumulative UV/sun exposure - diffuse scaly erythematous macules with underlying dyspigmentation - Recommend daily  broad spectrum sunscreen SPF 30+ to sun-exposed areas, reapply every 2 hours as needed.  - Staying in the shade or wearing long sleeves, sun glasses (UVA+UVB protection) and wide brim hats (4-inch brim around the entire circumference of the hat) are also recommended for sun protection.  - Call for new or changing lesions.  LENTIGINES, SEBORRHEIC KERATOSES, HEMANGIOMAS - Benign normal skin lesions - Benign-appearing - Call for any changes  MELANOCYTIC NEVI - Tan-brown and/or pink-flesh-colored symmetric macules and papules - Benign appearing on exam today - Observation - Call clinic for new or changing moles - Recommend daily use of broad spectrum spf 30+ sunscreen to sun-exposed areas.   HISTORY OF BASAL CELL CARCINOMA OF THE SKIN R CHEST MEDIAL INFRACLAVICULAR - No evidence of recurrence today - Recommend regular full body skin exams - Recommend daily broad spectrum sunscreen SPF 30+ to sun-exposed areas, reapply every 2 hours as needed.  - Call if any new or changing lesions are noted between office visits   History of Dysplastic Nevi on L Upper Back lat to spine and R Mid Back at braline - No evidence of recurrence today - Recommend regular full body skin exams - Recommend daily broad spectrum sunscreen SPF 30+ to sun-exposed areas, reapply every 2 hours as needed.  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   She was also on Nurtrofol, but has stopped. Exam: Diffuse thinning of the crown and widening of the midline part with retention of the frontal hairline  Female  Androgenic Alopecia is a chronic condition related to genetics and/or hormonal changes.  In women androgenetic alopecia is commonly associated with menopause but may occur any time after puberty.  It causes hair thinning primarily on the crown with  widening of the part and temporal hairline recession.  Can use OTC Rogaine  (minoxidil ) 5% solution/foam as directed.  Oral treatments in female patients who have no contraindication may include : - Low dose oral minoxidil  1.25 - 5mg  daily - Spironolactone 50 - 100mg  bid - Finasteride 2.5 - 5 mg daily Adjunctive therapies include: - Low Level Laser Light Therapy (LLLT) - Platelet-rich plasma injections (PRP) - Hair Transplants or scalp reduction   Treatment Plan: Continue treatment of Minoxidil  1.25 daily for hair loss. Pictures taken today Start oral Biotin daily at least 2.5 mg a day Long term medication management.  Patient is using long term (months to years) prescription medication  to control their dermatologic condition.  These medications require periodic monitoring to evaluate for efficacy and side effects and may require periodic laboratory monitoring.    INFLAMED SEBORRHEIC KERATOSIS (2) Right Supraclavicular Area (2) Destruction of lesion - Right Supraclavicular Area (2) Complexity: simple   Destruction method: cryotherapy   Informed consent: discussed and consent obtained   Timeout:  patient name, date of birth, surgical site, and procedure verified Lesion destroyed using liquid nitrogen: Yes   Region frozen until ice ball extended beyond lesion: Yes   Outcome: patient tolerated procedure well with no complications   Post-procedure details: wound care instructions given    NEOPLASM OF UNCERTAIN BEHAVIOR (2) Right Inframammary Fold Skin / nail biopsy Type of biopsy: tangential   Informed consent: discussed and consent obtained   Patient was prepped and draped in usual sterile fashion: Area prepped with alcohol. Anesthesia: the lesion was anesthetized in a standard fashion   Anesthetic:  1% lidocaine  w/ epinephrine 1-100,000 buffered w/ 8.4% NaHCO3 Instrument used: flexible razor blade   Hemostasis achieved with: pressure, aluminum chloride and electrodesiccation    Outcome: patient tolerated procedure well   Post-procedure details: wound care instructions given   Post-procedure details comment:  Ointment and small bandage applied  Specimen 1 - Surgical pathology Differential Diagnosis: Rule out DN  Check Margins: No Right lower back lateral above the posterior lateral waist line Skin / nail biopsy Type of biopsy: tangential   Informed consent: discussed and consent obtained   Patient was prepped and draped in usual sterile fashion: Area prepped with alcohol. Anesthesia: the lesion was anesthetized in a standard fashion   Anesthetic:  1% lidocaine  w/ epinephrine 1-100,000 buffered w/ 8.4% NaHCO3 Instrument used: flexible razor blade   Hemostasis achieved with: pressure, aluminum chloride and electrodesiccation   Outcome: patient tolerated procedure well   Post-procedure details: wound care instructions given   Post-procedure details comment:  Ointment and small bandage applied  Specimen 2 - Surgical pathology Differential Diagnosis: Rule out DN  Check Margins: No ANDROGENIC ALOPECIA   Related Medications minoxidil  (LONITEN ) 2.5 MG tablet Take 1/2 tab po QD. ACTINIC SKIN DAMAGE   HISTORY OF BASAL CELL CARCINOMA   SKIN CANCER SCREENING   LENTIGO   MELANOCYTIC NEVUS, UNSPECIFIED LOCATION   HISTORY OF DYSPLASTIC NEVUS   ANDROGENETIC ALOPECIA   No follow-ups on file.  I, Gordan Beams, CMA, am acting as scribe for Alm Rhyme, MD.   Documentation: I have reviewed the above documentation for accuracy and completeness, and I agree with the above.  Alm Rhyme, MD  .6cm right mammary Right  low back lateral above the posterior lateral waiste line  0.6 cm irregular brown macule  Shave removal

## 2023-12-06 NOTE — Patient Instructions (Signed)
 Wound Care Instructions  Cleanse wound gently with soap and water once a day then pat dry with clean gauze. Apply a thin coat of Petrolatum (petroleum jelly, Vaseline) over the wound (unless you have an allergy to this). We recommend that you use a new, sterile tube of Vaseline. Do not pick or remove scabs. Do not remove the yellow or white healing tissue from the base of the wound.  Cover the wound with fresh, clean, nonstick gauze and secure with paper tape. You may use Band-Aids in place of gauze and tape if the wound is small enough, but would recommend trimming much of the tape off as there is often too much. Sometimes Band-Aids can irritate the skin.  You should call the office for your biopsy report after 1 week if you have not already been contacted.  If you experience any problems, such as abnormal amounts of bleeding, swelling, significant bruising, significant pain, or evidence of infection, please call the office immediately.  FOR ADULT SURGERY PATIENTS: If you need something for pain relief you may take 1 extra strength Tylenol (acetaminophen) AND 2 Ibuprofen (200mg  each) together every 4 hours as needed for pain. (do not take these if you are allergic to them or if you have a reason you should not take them.) Typically, you may only need pain medication for 1 to 3 days.      Due to recent changes in healthcare laws, you may see results of your pathology and/or laboratory studies on MyChart before the doctors have had a chance to review them. We understand that in some cases there may be results that are confusing or concerning to you. Please understand that not all results are received at the same time and often the doctors may need to interpret multiple results in order to provide you with the best plan of care or course of treatment. Therefore, we ask that you please give us  2 business days to thoroughly review all your results before contacting the office for clarification. Should  we see a critical lab result, you will be contacted sooner.   If You Need Anything After Your Visit  If you have any questions or concerns for your doctor, please call our main line at [phone] and press option 4 to reach your doctor's medical assistant. If no one answers, please leave a voicemail as directed and we will return your call as soon as possible. Messages left after 4 pm will be answered the following business day.   You may also send us  a message via MyChart. We typically respond to MyChart messages within 1-2 business days.  For prescription refills, please ask your pharmacy to contact our office. Our fax number is [phone].  If you have an urgent issue when the clinic is closed that cannot wait until the next business day, you can page your doctor at the number below.    Please note that while we do our best to be available for urgent issues outside of office hours, we are not available 24/7.   If you have an urgent issue and are unable to reach us , you may choose to seek medical care at your doctor's office, retail clinic, urgent care center, or emergency room.  If you have a medical emergency, please immediately call 911 or go to the emergency department.  Pager Numbers  - Dr. Hester: [phone]  - Dr. Jackquline: [phone]  - Dr. Claudene: [phone]   - Dr. Raymund: [phone]  In the event of inclement  weather, please call our main line at [phone] for an update on the status of any delays or closures.  Dermatology Medication Tips: Please keep the boxes that topical medications come in in order to help keep track of the instructions about where and how to use these. Pharmacies typically print the medication instructions only on the boxes and not directly on the medication tubes.   If your medication is too expensive, please contact our office at [phone] option 4 or send us  a message through MyChart.   We are unable to tell what your co-pay  for medications will be in advance as this is different depending on your insurance coverage. However, we may be able to find a substitute medication at lower cost or fill out paperwork to get insurance to cover a needed medication.   If a prior authorization is required to get your medication covered by your insurance company, please allow us  1-2 business days to complete this process.  Drug prices often vary depending on where the prescription is filled and some pharmacies may offer cheaper prices.  The website www.goodrx.com contains coupons for medications through different pharmacies. The prices here do not account for what the cost may be with help from insurance (it may be cheaper with your insurance), but the website can give you the price if you did not use any insurance.  - You can print the associated coupon and take it with your prescription to the pharmacy.  - You may also stop by our office during regular business hours and pick up a GoodRx coupon card.  - If you need your prescription sent electronically to a different pharmacy, notify our office through [facility] or by phone at [phone] option 4.     Si Usted Necesita Algo Despus de Su Visita  Tambin puede enviarnos un mensaje a travs de Clinical cytogeneticist. Por lo general respondemos a los mensajes de MyChart en el transcurso de 1 a 2 das hbiles.  Para renovar recetas, por favor pida a su farmacia que se ponga en contacto con nuestra oficina. Randi lakes de fax es [place] [phone].  Si tiene un asunto urgente cuando la clnica est cerrada y que no puede esperar hasta el siguiente da hbil, puede llamar/localizar a su doctor(a) al nmero que aparece a continuacin.   Por favor, tenga en cuenta que aunque hacemos todo lo posible para estar disponibles para asuntos urgentes fuera del horario de [place], no estamos disponibles las 24 horas del da, los 7 [address]  [address] de la [place].   Si tiene un problema urgente y no puede  comunicarse con nosotros, puede optar por buscar atencin mdica  en el consultorio de su doctor(a), en una clnica privada, en un centro de atencin urgente o en una sala de emergencias.  Si tiene Engineer, drilling, por favor llame inmediatamente al 911 o vaya a la sala de emergencias.  Nmeros de bper  - Dr. Hester: [phone]  - Dra. Jackquline: [phone]  - Dr. Claudene: [phone]  - Dra. Kitts: [phone]  En caso de inclemencias del [place], por favor llame a nuestra lnea principal al [phone] para una actualizacin sobre el estado de cualquier retraso o cierre.  Consejos para la medicacin en dermatologa: Por favor, guarde las cajas en las que vienen los medicamentos de uso tpico para ayudarle a seguir las instrucciones sobre dnde y cmo usarlos. Las farmacias generalmente imprimen las instrucciones del medicamento slo en las cajas y no directamente en los tubos del [place].  Si su medicamento es muy caro, por favor, pngase en contacto con landry rieger llamando al [phone] y presione la opcin 4 o envenos un mensaje a travs de Clinical cytogeneticist.   No podemos decirle cul ser su copago por los medicamentos por adelantado ya que esto es diferente dependiendo de la cobertura de su seguro. Sin embargo, es posible que podamos encontrar un medicamento sustituto a Audiological scientist un formulario para que el seguro cubra el medicamento que se considera necesario.   Si se requiere una autorizacin previa para que su compaa de seguros malta su medicamento, por favor permtanos de 1 a 2 das hbiles para completar este proceso.  Los precios de los medicamentos varan con frecuencia dependiendo del Environmental consultant de dnde se surte la receta y alguna farmacias pueden ofrecer precios ms baratos.  El sitio web www.goodrx.com tiene cupones para medicamentos de Health and safety inspector. Los precios aqu no tienen en cuenta lo que podra costar con la ayuda del seguro (puede ser ms  barato con su seguro), pero el sitio web puede darle el precio si no utiliz Tourist information centre manager.  - Puede imprimir el cupn correspondiente y llevarlo con su receta a la farmacia.  - Tambin puede pasar por nuestra oficina durante el horario de atencin regular y Education officer, museum una tarjeta de cupones de GoodRx.  - Si necesita que su receta se enve electrnicamente a una farmacia diferente, informe a nuestra oficina a travs de MyChart de [place] o por telfono llamando al [phone] y presione la opcin 4. Wound Care Instructions  Cleanse wound gently with soap and water once a day then pat dry with clean gauze. Apply a thin coat of Petrolatum (petroleum jelly, Vaseline) over the wound (unless you have an allergy to this). We recommend that you use a new, sterile tube of Vaseline. Do not pick or remove scabs. Do not remove the yellow or white healing tissue from the base of the wound.  Cover the wound with fresh, clean, nonstick gauze and secure with paper tape. You may use Band-Aids in place of gauze and tape if the wound is small enough, but would recommend trimming much of the tape off as there is often too much. Sometimes Band-Aids can irritate the skin.  You should call the office for your biopsy report after 1 week if you have not already been contacted.  If you experience any problems, such as abnormal amounts of bleeding, swelling, significant bruising, significant pain, or evidence of infection, please call the office immediately.  FOR ADULT SURGERY PATIENTS: If you need something for pain relief you may take 1 extra strength Tylenol (acetaminophen) AND 2 Ibuprofen (200mg  each) together every 4 hours as needed for pain. (do not take these if you are allergic to them or if you have a reason you should not take them.) Typically, you may only need pain medication for 1 to 3 days.

## 2023-12-07 LAB — SURGICAL PATHOLOGY

## 2023-12-08 ENCOUNTER — Ambulatory Visit: Payer: Self-pay | Admitting: Dermatology

## 2023-12-11 ENCOUNTER — Encounter: Payer: Self-pay | Admitting: Dermatology

## 2023-12-11 NOTE — Telephone Encounter (Signed)
-----   Message from Alm Rhyme sent at 12/08/2023  1:54 PM EDT ----- FINAL DIAGNOSIS        1. Skin, right inframammary fold :       DYSPLASTIC JUNCTIONAL NEVUS WITH MODERATE ATYPIA, PERIPHERAL MARGIN INVOLVED        2. Skin, right lower back lateral above the posterior lateral waist line :       DYSPLASTIC JUNCTIONAL NEVUS WITH MODERATE ATYPIA, CLOSE TO MARGIN   1&2 - Both Moderate dysplastic Recheck next visit ----- Message ----- From: Interface, Lab In Three Zero One Sent: 12/07/2023   6:39 PM EDT To: Alm JAYSON Rhyme, MD

## 2023-12-11 NOTE — Telephone Encounter (Signed)
 Lft pt msg to call for bx results/sh

## 2023-12-11 NOTE — Telephone Encounter (Signed)
 Patient advised of BX results. aw

## 2024-12-04 ENCOUNTER — Ambulatory Visit: Admitting: Dermatology
